# Patient Record
Sex: Female | Born: 1955 | Race: White | Hispanic: No | Marital: Married | Smoking: Never smoker
Health system: Southern US, Community
[De-identification: ages and names within clinical notes are randomized; demographics above are authoritative.]

## PROBLEM LIST (undated history)

## (undated) ENCOUNTER — Emergency Department: Discharge status: Absent without leave | Payer: PRIVATE HEALTH INSURANCE

## (undated) DIAGNOSIS — M1990 Unspecified osteoarthritis, unspecified site: Secondary | ICD-10-CM

## (undated) DIAGNOSIS — M8580 Other specified disorders of bone density and structure, unspecified site: Secondary | ICD-10-CM

## (undated) DIAGNOSIS — G4700 Insomnia, unspecified: Secondary | ICD-10-CM

## (undated) DIAGNOSIS — K219 Gastro-esophageal reflux disease without esophagitis: Secondary | ICD-10-CM

## (undated) DIAGNOSIS — E785 Hyperlipidemia, unspecified: Secondary | ICD-10-CM

## (undated) DIAGNOSIS — R519 Headache, unspecified: Secondary | ICD-10-CM

## (undated) DIAGNOSIS — K759 Inflammatory liver disease, unspecified: Secondary | ICD-10-CM

## (undated) DIAGNOSIS — B009 Herpesviral infection, unspecified: Secondary | ICD-10-CM

## (undated) DIAGNOSIS — T7840XA Allergy, unspecified, initial encounter: Secondary | ICD-10-CM

## (undated) DIAGNOSIS — I10 Essential (primary) hypertension: Secondary | ICD-10-CM

## (undated) HISTORY — PX: JOINT REPLACEMENT: SHX530

## (undated) HISTORY — DX: Essential (primary) hypertension: I10

## (undated) HISTORY — PX: TUBAL LIGATION: SHX77

## (undated) HISTORY — DX: Hyperlipidemia, unspecified: E78.5

## (undated) HISTORY — DX: Insomnia, unspecified: G47.00

## (undated) HISTORY — DX: Herpesviral infection, unspecified: B00.9

## (undated) HISTORY — DX: Allergy, unspecified, initial encounter: T78.40XA

## (undated) HISTORY — DX: Other specified disorders of bone density and structure, unspecified site: M85.80

---

## 1999-02-17 ENCOUNTER — Ambulatory Visit: Admission: RE | Admit: 1999-02-17 | Discharge: 1999-02-17 | Payer: Self-pay | Admitting: Pulmonary Disease

## 1999-04-15 ENCOUNTER — Other Ambulatory Visit: Admission: RE | Admit: 1999-04-15 | Discharge: 1999-04-15 | Payer: Self-pay | Admitting: Gynecology

## 2000-04-19 ENCOUNTER — Other Ambulatory Visit: Admission: RE | Admit: 2000-04-19 | Discharge: 2000-04-19 | Payer: Self-pay | Admitting: Gynecology

## 2001-04-21 ENCOUNTER — Other Ambulatory Visit: Admission: RE | Admit: 2001-04-21 | Discharge: 2001-04-21 | Payer: Self-pay | Admitting: Gynecology

## 2004-10-08 ENCOUNTER — Other Ambulatory Visit: Admission: RE | Admit: 2004-10-08 | Discharge: 2004-10-08 | Payer: Self-pay | Admitting: Gynecology

## 2005-11-25 ENCOUNTER — Other Ambulatory Visit: Admission: RE | Admit: 2005-11-25 | Discharge: 2005-11-25 | Payer: Self-pay | Admitting: Gynecology

## 2008-09-14 ENCOUNTER — Encounter: Payer: Self-pay | Admitting: Women's Health

## 2008-09-14 ENCOUNTER — Other Ambulatory Visit: Admission: RE | Admit: 2008-09-14 | Discharge: 2008-09-14 | Payer: Self-pay | Admitting: Gynecology

## 2008-09-14 ENCOUNTER — Ambulatory Visit: Payer: Self-pay | Admitting: Women's Health

## 2013-06-15 ENCOUNTER — Ambulatory Visit (INDEPENDENT_AMBULATORY_CARE_PROVIDER_SITE_OTHER): Payer: PRIVATE HEALTH INSURANCE | Admitting: Women's Health

## 2013-06-15 ENCOUNTER — Encounter: Payer: Self-pay | Admitting: Women's Health

## 2013-06-15 ENCOUNTER — Other Ambulatory Visit (HOSPITAL_COMMUNITY)
Admission: RE | Admit: 2013-06-15 | Discharge: 2013-06-15 | Disposition: A | Discharge status: Home / Self Care | Payer: PRIVATE HEALTH INSURANCE | Source: Ambulatory Visit | Attending: Gynecology | Admitting: Gynecology

## 2013-06-15 VITALS — BP 130/90 | Ht 64.0 in | Wt 167.0 lb

## 2013-06-15 DIAGNOSIS — Z01419 Encounter for gynecological examination (general) (routine) without abnormal findings: Secondary | ICD-10-CM | POA: Insufficient documentation

## 2013-06-15 DIAGNOSIS — N949 Unspecified condition associated with female genital organs and menstrual cycle: Secondary | ICD-10-CM

## 2013-06-15 DIAGNOSIS — I10 Essential (primary) hypertension: Secondary | ICD-10-CM | POA: Insufficient documentation

## 2013-06-15 DIAGNOSIS — N9489 Other specified conditions associated with female genital organs and menstrual cycle: Secondary | ICD-10-CM

## 2013-06-15 DIAGNOSIS — R300 Dysuria: Secondary | ICD-10-CM

## 2013-06-15 LAB — URINALYSIS W MICROSCOPIC + REFLEX CULTURE
Bilirubin Urine: NEGATIVE
Glucose, UA: NEGATIVE mg/dL
Protein, ur: NEGATIVE mg/dL

## 2013-06-15 LAB — WET PREP FOR TRICH, YEAST, CLUE
Trich, Wet Prep: NONE SEEN
Yeast Wet Prep HPF POC: NONE SEEN

## 2013-06-15 NOTE — Addendum Note (Signed)
Addended by: Richardson Chiquito on: 06/15/2013 12:48 PM   Modules accepted: Orders

## 2013-06-15 NOTE — Patient Instructions (Signed)
Colonoscopy Dexa Vit D 2000 daily Health Recommendations for Postmenopausal Women Respected and ongoing research has looked at the most common causes of death, disability, and poor quality of life in postmenopausal women. The causes include heart disease, diseases of blood vessels, diabetes, depression, cancer, and bone loss (osteoporosis). Many things can be done to help lower the chances of developing these and other common problems: CARDIOVASCULAR DISEASE Heart Disease: A heart attack is a medical emergency. Know the signs and symptoms of a heart attack. Below are things women can do to reduce their risk for heart disease.   Do not smoke. If you smoke, quit.  Aim for a healthy weight. Being overweight causes many preventable deaths. Eat a healthy and balanced diet and drink an adequate amount of liquids.  Get moving. Make a commitment to be more physically active. Aim for 30 minutes of activity on most, if not all days of the week.  Eat for heart health. Choose a diet that is low in saturated fat and cholesterol and eliminate trans fat. Include whole grains, vegetables, and fruits. Read and understand the labels on food containers before buying.  Know your numbers. Ask your caregiver to check your blood pressure, cholesterol (total, HDL, LDL, triglycerides) and blood glucose. Work with your caregiver on improving your entire clinical picture.  High blood pressure. Limit or stop your table salt intake (try salt substitute and food seasonings). Avoid salty foods and drinks. Read labels on food containers before buying. Eating well and exercising can help control high blood pressure. STROKE  Stroke is a medical emergency. Stroke may be the result of a blood clot in a blood vessel in the brain or by a brain hemorrhage (bleeding). Know the signs and symptoms of a stroke. To lower the risk of developing a stroke:  Avoid fatty foods.  Quit smoking.  Control your diabetes, blood pressure, and  irregular heart rate. THROMBOPHLEBITIS (BLOOD CLOT) OF THE LEG  Becoming overweight and leading a stationary lifestyle may also contribute to developing blood clots. Controlling your diet and exercising will help lower the risk of developing blood clots. CANCER SCREENING  Breast Cancer: Take steps to reduce your risk of breast cancer.  You should practice "breast self-awareness." This means understanding the normal appearance and feel of your breasts and should include breast self-examination. Any changes detected, no matter how small, should be reported to your caregiver.  After age 34, you should have a clinical breast exam (CBE) every year.  Starting at age 40, you should consider having a mammogram (breast X-ray) every year.  If you have a family history of breast cancer, talk to your caregiver about genetic screening.  If you are at high risk for breast cancer, talk to your caregiver about having an MRI and a mammogram every year.  Intestinal or Stomach Cancer: Tests to consider are a rectal exam, fecal occult blood, sigmoidoscopy, and colonoscopy. Women who are high risk may need to be screened at an earlier age and more often.  Cervical Cancer:  Beginning at age 79, you should have a Pap test every 3 years as long as the past 3 Pap tests have been normal.  If you have had past treatment for cervical cancer or a condition that could lead to cancer, you need Pap tests and screening for cancer for at least 20 years after your treatment.  If you had a hysterectomy for a problem that was not cancer or a condition that could lead to cancer, then you  no longer need Pap tests.  If you are between ages 71 and 21, and you have had normal Pap tests going back 10 years, you no longer need Pap tests.  If Pap tests have been discontinued, risk factors (such as a new sexual partner) need to be reassessed to determine if screening should be resumed.  Some medical problems can increase the  chance of getting cervical cancer. In these cases, your caregiver may recommend more frequent screening and Pap tests.  Uterine Cancer: If you have vaginal bleeding after reaching menopause, you should notify your caregiver.  Ovarian cancer: Other than yearly pelvic exams, there are no reliable tests available to screen for ovarian cancer at this time except for yearly pelvic exams.  Lung Cancer: Yearly chest X-rays can detect lung cancer and should be done on high risk women, such as cigarette smokers and women with chronic lung disease (emphysema).  Skin Cancer: A complete body skin exam should be done at your yearly examination. Avoid overexposure to the sun and ultraviolet light lamps. Use a strong sun block cream when in the sun. All of these things are important in lowering the risk of skin cancer. MENOPAUSE Menopause Symptoms: Hormone therapy products are effective for treating symptoms associated with menopause:  Moderate to severe hot flashes.  Night sweats.  Mood swings.  Headaches.  Tiredness.  Loss of sex drive.  Insomnia.  Other symptoms. Hormone replacement carries certain risks, especially in older women. Women who use or are thinking about using estrogen or estrogen with progestin treatments should discuss that with their caregiver. Your caregiver will help you understand the benefits and risks. The ideal dose of hormone replacement therapy is not known. The Food and Drug Administration (FDA) has concluded that hormone therapy should be used only at the lowest doses and for the shortest amount of time to reach treatment goals.  OSTEOPOROSIS Protecting Against Bone Loss and Preventing Fracture: If you use hormone therapy for prevention of bone loss (osteoporosis), the risks for bone loss must outweigh the risk of the therapy. Ask your caregiver about other medications known to be safe and effective for preventing bone loss and fractures. To guard against bone loss or  fractures, the following is recommended:  If you are less than age 88, take 1000 mg of calcium and at least 600 mg of Vitamin D per day.  If you are greater than age 12 but less than age 55, take 1200 mg of calcium and at least 600 mg of Vitamin D per day.  If you are greater than age 66, take 1200 mg of calcium and at least 800 mg of Vitamin D per day. Smoking and excessive alcohol intake increases the risk of osteoporosis. Eat foods rich in calcium and vitamin D and do weight bearing exercises several times a week as your caregiver suggests. DIABETES Diabetes Melitus: If you have Type I or Type 2 diabetes, you should keep your blood sugar under control with diet, exercise and recommended medication. Avoid too many sweets, starchy and fatty foods. Being overweight can make control more difficult. COGNITION AND MEMORY Cognition and Memory: Menopausal hormone therapy is not recommended for the prevention of cognitive disorders such as Alzheimer's disease or memory loss.  DEPRESSION  Depression may occur at any age, but is common in elderly women. The reasons may be because of physical, medical, social (loneliness), or financial problems and needs. If you are experiencing depression because of medical problems and control of symptoms, talk to your  caregiver about this. Physical activity and exercise may help with mood and sleep. Community and volunteer involvement may help your sense of value and worth. If you have depression and you feel that the problem is getting worse or becoming severe, talk to your caregiver about treatment options that are best for you. ACCIDENTS  Accidents are common and can be serious in the elderly woman. Prepare your house to prevent accidents. Eliminate throw rugs, place hand bars in the bath, shower and toilet areas. Avoid wearing high heeled shoes or walking on wet, snowy, and icy areas. Limit or stop driving if you have vision or hearing problems, or you feel you are  unsteady with you movements and reflexes. HEPATITIS C Hepatitis C is a type of viral infection affecting the liver. It is spread mainly through contact with blood from an infected person. It can be treated, but if left untreated, it can lead to severe liver damage over years. Many people who are infected do not know that the virus is in their blood. If you are a "baby-boomer", it is recommended that you have one screening test for Hepatitis C. IMMUNIZATIONS  Several immunizations are important to consider having during your senior years, including:   Tetanus, diptheria, and pertussis booster shot.  Influenza every year before the flu season begins.  Pneumonia vaccine.  Shingles vaccine.  Others as indicated based on your specific needs. Talk to your caregiver about these. Document Released: 02/05/2006 Document Revised: 11/30/2012 Document Reviewed: 10/01/2008 [facility] Patient Information 2014 [place].

## 2013-06-15 NOTE — Progress Notes (Signed)
Katrina Simpson [birth year] [id number]    History:    The patient presents for annual exam.  Postmenopausal with no bleeding/no HRT. Last office visit 2009, has gained 25 pounds since. Recently diagnosed with hypertension and started on medication last week by primary care. History of normal mammograms. LGSIL with negative biopsy 1996 with normal Paps after.   Past medical history, past surgical history, family history and social history were all reviewed and documented in the EPIC chart. 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 Four sons, 35, 30, twins 53, all doing well.   ROS:  A  ROS was performed and pertinent positives and negatives are included in the history.  Exam:  Filed Vitals:   06/15/13 1155  BP: 130/90    General appearance:  Normal Head/Neck:  Normal, without cervical or supraclavicular adenopathy. Thyroid:  Symmetrical, normal in size, without palpable masses or nodularity. Respiratory  Effort:  Normal  Auscultation:  Clear without wheezing or rhonchi Cardiovascular  Auscultation:  Regular rate, without rubs, murmurs or gallops  Edema/varicosities:  Not grossly evident Abdominal  Soft,nontender, without masses, guarding or rebound.  Liver/spleen:  No organomegaly noted  Hernia:  None appreciated  Skin  Inspection:  Grossly normal  Palpation:  Grossly normal Neurologic/psychiatric  Orientation:  Normal with appropriate conversation.  Mood/affect:  Normal  Genitourinary    Breasts: Examined lying and sitting.     Right: Without masses, retractions, discharge or axillary adenopathy.     Left: Without masses, retractions, discharge or axillary adenopathy.   Inguinal/mons:  Normal without inguinal adenopathy  External genitalia:  Several sebaceous cysts drained white dry material  BUS/Urethra/Skene's glands:  Normal  Bladder:  Normal  Vagina:  Atrophic  Cervix:  Normal  Uterus:   normal in size, shape and contour.  Midline and mobile  Adnexa/parametria:      Rt: Without masses or tenderness.   Lt: Without masses or tenderness.  Anus and perineum: Normal  Digital rectal exam: Normal sphincter tone without palpated masses or tenderness  Assessment/Plan:  57 y.o. MWF G3P4  for annual exam complaint of vaginal burning and questionable UTI.  Recently diagnosed hypertension managed by primary care labs and meds Postmenopausal/no bleeding/no HRT with vaginal atrophy  Plan: Reviewed normality of wet prep, vaginal atrophy, encouraged vaginal lubricants with intercourse. UA 3-6 WBCs, few bacteria- culture pending. SBE's, annual mammogram, calcium rich diet, vitamin D 2000 daily encouraged. Instructed to schedule a DEXA, screening colonoscopy, Pap, new screening guidelines reviewed. Discussed importance of decreasing calories and increasing exercise for weight loss and health.    [facility] Chall[facility], 12:29 PM 06/15/2013

## 2013-06-17 LAB — URINE CULTURE: Colony Count: 50000

## 2013-06-19 ENCOUNTER — Encounter: Payer: Self-pay | Admitting: Women's Health

## 2013-12-05 ENCOUNTER — Encounter: Payer: Self-pay | Admitting: Women's Health

## 2013-12-05 ENCOUNTER — Ambulatory Visit (INDEPENDENT_AMBULATORY_CARE_PROVIDER_SITE_OTHER): Payer: PRIVATE HEALTH INSURANCE | Admitting: Women's Health

## 2013-12-05 DIAGNOSIS — R350 Frequency of micturition: Secondary | ICD-10-CM

## 2013-12-05 DIAGNOSIS — N9489 Other specified conditions associated with female genital organs and menstrual cycle: Secondary | ICD-10-CM

## 2013-12-05 DIAGNOSIS — N949 Unspecified condition associated with female genital organs and menstrual cycle: Secondary | ICD-10-CM

## 2013-12-05 DIAGNOSIS — N390 Urinary tract infection, site not specified: Secondary | ICD-10-CM

## 2013-12-05 LAB — WET PREP FOR TRICH, YEAST, CLUE
Clue Cells Wet Prep HPF POC: NONE SEEN
Yeast Wet Prep HPF POC: NONE SEEN

## 2013-12-05 LAB — URINALYSIS W MICROSCOPIC + REFLEX CULTURE
Casts: NONE SEEN
Hgb urine dipstick: NEGATIVE
Ketones, ur: NEGATIVE mg/dL
Nitrite: POSITIVE — AB
Protein, ur: NEGATIVE mg/dL
Urobilinogen, UA: 1 mg/dL (ref 0.0–1.0)

## 2013-12-05 MED ORDER — CIPROFLOXACIN HCL 250 MG PO TABS: 250.0000 mg | ORAL_TABLET | Freq: Two times a day (BID) | ORAL | Status: DC

## 2013-12-05 NOTE — Patient Instructions (Signed)
Urinary Tract Infection  Urinary tract infections (UTIs) can develop anywhere along your urinary tract. Your urinary tract is your body's drainage system for removing wastes and extra water. Your urinary tract includes two kidneys, two ureters, a bladder, and a urethra. Your kidneys are a pair of bean-shaped organs. Each kidney is about the size of your fist. They are located below your ribs, one on each side of your spine.  CAUSES  Infections are caused by microbes, which are microscopic organisms, including fungi, viruses, and bacteria. These organisms are so small that they can only be seen through a microscope. Bacteria are the microbes that most commonly cause UTIs.  SYMPTOMS   Symptoms of UTIs may vary by age and gender of the patient and by the location of the infection. Symptoms in young women typically include a frequent and intense urge to urinate and a painful, burning feeling in the bladder or urethra during urination. Older women and men are more likely to be tired, shaky, and weak and have muscle aches and abdominal pain. A fever may mean the infection is in your kidneys. Other symptoms of a kidney infection include pain in your back or sides below the ribs, nausea, and vomiting.  DIAGNOSIS  To diagnose a UTI, your caregiver will ask you about your symptoms. Your caregiver also will ask to provide a urine sample. The urine sample will be tested for bacteria and white blood cells. White blood cells are made by your body to help fight infection.  TREATMENT   Typically, UTIs can be treated with medication. Because most UTIs are caused by a bacterial infection, they usually can be treated with the use of antibiotics. The choice of antibiotic and length of treatment depend on your symptoms and the type of bacteria causing your infection.  HOME CARE INSTRUCTIONS   If you were prescribed antibiotics, take them exactly as your caregiver instructs you. Finish the medication even if you feel better after you  have only taken some of the medication.   Drink enough water and fluids to keep your urine clear or pale yellow.   Avoid caffeine, tea, and carbonated beverages. They tend to irritate your bladder.   Empty your bladder often. Avoid holding urine for long periods of time.   Empty your bladder before and after sexual intercourse.   After a bowel movement, women should cleanse from front to back. Use each tissue only once.  SEEK MEDICAL CARE IF:    You have back pain.   You develop a fever.   Your symptoms do not begin to resolve within 3 days.  SEEK IMMEDIATE MEDICAL CARE IF:    You have severe back pain or lower abdominal pain.   You develop chills.   You have nausea or vomiting.   You have continued burning or discomfort with urination.  MAKE SURE YOU:    Understand these instructions.   Will watch your condition.   Will get help right away if you are not doing well or get worse.  Document Released: 09/23/2005 Document Revised: 06/14/2012 Document Reviewed: 01/22/2012  [employer] Patient Information 2014 [employer].

## 2013-12-05 NOTE — Progress Notes (Signed)
Patient ID: Katrina Simpson, female   DOB: May 21, 1956, 57 y.o.   MRN: [id number] Presents with urinary frequency with urgency and burning. Also vaginal burning. Denies abdominal pain, fever. Had minimal relief with over-the-counter azo.  Exam: Appears well. UA: Small leukocytes, 7-10 WBCs, few bacteria. External genitalia within normal limits, several sebaceous cysts nontender. Speculum exam scant white discharge no erythema or odor noted. Wet prep negative. Bimanual no CMT or adnexal fullness or tenderness.  UTI  Plan: Cipro 250 mg twice daily for 3 days. UTI prevention discussed. Instructed to call if no relief of symptoms. Encourage vaginal lubricants with intercourse.

## 2014-06-22 ENCOUNTER — Encounter: Payer: PRIVATE HEALTH INSURANCE | Admitting: Women's Health

## 2014-06-27 ENCOUNTER — Encounter: Payer: PRIVATE HEALTH INSURANCE | Admitting: Women's Health

## 2014-08-24 ENCOUNTER — Encounter: Payer: Self-pay | Admitting: Women's Health

## 2014-08-24 ENCOUNTER — Ambulatory Visit (INDEPENDENT_AMBULATORY_CARE_PROVIDER_SITE_OTHER): Payer: PRIVATE HEALTH INSURANCE | Admitting: Women's Health

## 2014-08-24 VITALS — BP 124/82 | Ht 63.5 in | Wt 157.2 lb

## 2014-08-24 DIAGNOSIS — Z01419 Encounter for gynecological examination (general) (routine) without abnormal findings: Secondary | ICD-10-CM

## 2014-08-24 DIAGNOSIS — Z780 Asymptomatic menopausal state: Secondary | ICD-10-CM

## 2014-08-24 NOTE — Patient Instructions (Signed)
Health Recommendations for Postmenopausal Women Respected and ongoing research has looked at the most common causes of death, disability, and poor quality of life in postmenopausal women. The causes include heart disease, diseases of blood vessels, diabetes, depression, cancer, and bone loss (osteoporosis). Many things can be done to help lower the chances of developing these and other common problems. CARDIOVASCULAR DISEASE Heart Disease: A heart attack is a medical emergency. Know the signs and symptoms of a heart attack. Below are things women can do to reduce their risk for heart disease.   Do not smoke. If you smoke, quit.  Aim for a healthy weight. Being overweight causes many preventable deaths. Eat a healthy and balanced diet and drink an adequate amount of liquids.  Get moving. Make a commitment to be more physically active. Aim for 30 minutes of activity on most, if not all days of the week.  Eat for heart health. Choose a diet that is low in saturated fat and cholesterol and eliminate trans fat. Include whole grains, vegetables, and fruits. Read and understand the labels on food containers before buying.  Know your numbers. Ask your caregiver to check your blood pressure, cholesterol (total, HDL, LDL, triglycerides) and blood glucose. Work with your caregiver on improving your entire clinical picture.  High blood pressure. Limit or stop your table salt intake (try salt substitute and food seasonings). Avoid salty foods and drinks. Read labels on food containers before buying. Eating well and exercising can help control high blood pressure. STROKE  Stroke is a medical emergency. Stroke may be the result of a blood clot in a blood vessel in the brain or by a brain hemorrhage (bleeding). Know the signs and symptoms of a stroke. To lower the risk of developing a stroke:  Avoid fatty foods.  Quit smoking.  Control your diabetes, blood pressure, and irregular heart rate. THROMBOPHLEBITIS  (BLOOD CLOT) OF THE LEG  Becoming overweight and leading a stationary lifestyle may also contribute to developing blood clots. Controlling your diet and exercising will help lower the risk of developing blood clots. CANCER SCREENING  Breast Cancer: Take steps to reduce your risk of breast cancer.  You should practice "breast self-awareness." This means understanding the normal appearance and feel of your breasts and should include breast self-examination. Any changes detected, no matter how small, should be reported to your caregiver.  After age 40, you should have a clinical breast exam (CBE) every year.  Starting at age 40, you should consider having a mammogram (breast X-ray) every year.  If you have a family history of breast cancer, talk to your caregiver about genetic screening.  If you are at high risk for breast cancer, talk to your caregiver about having an MRI and a mammogram every year.  Intestinal or Stomach Cancer: Tests to consider are a rectal exam, fecal occult blood, sigmoidoscopy, and colonoscopy. Women who are high risk may need to be screened at an earlier age and more often.  Cervical Cancer:  Beginning at age 30, you should have a Pap test every 3 years as long as the past 3 Pap tests have been normal.  If you have had past treatment for cervical cancer or a condition that could lead to cancer, you need Pap tests and screening for cancer for at least 20 years after your treatment.  If you had a hysterectomy for a problem that was not cancer or a condition that could lead to cancer, then you no longer need Pap tests.    If you are between ages 65 and 70, and you have had normal Pap tests going back 10 years, you no longer need Pap tests.  If Pap tests have been discontinued, risk factors (such as a new sexual partner) need to be reassessed to determine if screening should be resumed.  Some medical problems can increase the chance of getting cervical cancer. In these  cases, your caregiver may recommend more frequent screening and Pap tests.  Uterine Cancer: If you have vaginal bleeding after reaching menopause, you should notify your caregiver.  Ovarian Cancer: Other than yearly pelvic exams, there are no reliable tests available to screen for ovarian cancer at this time except for yearly pelvic exams.  Lung Cancer: Yearly chest X-rays can detect lung cancer and should be done on high risk women, such as cigarette smokers and women with chronic lung disease (emphysema).  Skin Cancer: A complete body skin exam should be done at your yearly examination. Avoid overexposure to the sun and ultraviolet light lamps. Use a strong sun block cream when in the sun. All of these things are important for lowering the risk of skin cancer. MENOPAUSE Menopause Symptoms: Hormone therapy products are effective for treating symptoms associated with menopause:  Moderate to severe hot flashes.  Night sweats.  Mood swings.  Headaches.  Tiredness.  Loss of sex drive.  Insomnia.  Other symptoms. Hormone replacement carries certain risks, especially in older women. Women who use or are thinking about using estrogen or estrogen with progestin treatments should discuss that with their caregiver. Your caregiver will help you understand the benefits and risks. The ideal dose of hormone replacement therapy is not known. The Food and Drug Administration (FDA) has concluded that hormone therapy should be used only at the lowest doses and for the shortest amount of time to reach treatment goals.  OSTEOPOROSIS Protecting Against Bone Loss and Preventing Fracture If you use hormone therapy for prevention of bone loss (osteoporosis), the risks for bone loss must outweigh the risk of the therapy. Ask your caregiver about other medications known to be safe and effective for preventing bone loss and fractures. To guard against bone loss or fractures, the following is recommended:  If  you are younger than age 50, take 1000 mg of calcium and at least 600 mg of Vitamin D per day.  If you are older than age 50 but younger than age 70, take 1200 mg of calcium and at least 600 mg of Vitamin D per day.  If you are older than age 70, take 1200 mg of calcium and at least 800 mg of Vitamin D per day. Smoking and excessive alcohol intake increases the risk of osteoporosis. Eat foods rich in calcium and vitamin D and do weight bearing exercises several times a week as your caregiver suggests. DIABETES Diabetes Mellitus: If you have type I or type 2 diabetes, you should keep your blood sugar under control with diet, exercise, and recommended medication. Avoid starchy and fatty foods, and too many sweets. Being overweight can make diabetes control more difficult. COGNITION AND MEMORY Cognition and Memory: Menopausal hormone therapy is not recommended for the prevention of cognitive disorders such as Alzheimer's disease or memory loss.  DEPRESSION  Depression may occur at any age, but it is common in elderly women. This may be because of physical, medical, social (loneliness), or financial problems and needs. If you are experiencing depression because of medical problems and control of symptoms, talk to your caregiver about this. Physical   activity and exercise may help with mood and sleep. Community and volunteer involvement may improve your sense of value and worth. If you have depression and you feel that the problem is getting worse or becoming severe, talk to your caregiver about which treatment options are best for you. ACCIDENTS  Accidents are common and can be serious in elderly woman. Prepare your house to prevent accidents. Eliminate throw rugs, place hand bars in bath, shower, and toilet areas. Avoid wearing high heeled shoes or walking on wet, snowy, and icy areas. Limit or stop driving if you have vision or hearing problems, or if you feel you are unsteady with your movements and  reflexes. HEPATITIS C Hepatitis C is a type of viral infection affecting the liver. It is spread mainly through contact with blood from an infected person. It can be treated, but if left untreated, it can lead to severe liver damage over the years. Many people who are infected do not know that the virus is in their blood. If you are a "baby-boomer", it is recommended that you have one screening test for Hepatitis C. IMMUNIZATIONS  Several immunizations are important to consider having during your senior years, including:   Tetanus, diphtheria, and pertussis booster shot.  Influenza every year before the flu season begins.  Pneumonia vaccine.  Shingles vaccine.  Others, as indicated based on your specific needs. Talk to your caregiver about these. Document Released: 02/05/2006 Document Revised: 04/30/2014 Document Reviewed: 10/01/2008 ExitCare Patient Information 2015 [employer]. This information is not intended to replace advice given to you by your health care provider. Make sure you discuss any questions you have with your health care provider.  

## 2014-08-24 NOTE — Progress Notes (Signed)
Katrina Simpson May 18, 1956 [id number]    History:    Presents for annual exam.  Postmenopausal/no bleeding/no HRT. Has not had a mammogram in years. No DEXA, no colonoscopy. LGSIL 1996 with negative biopsy with all normal Paps after. Hypertension diagnosed in 2014.  Past medical history, past surgical history, family history and social history were all reviewed and documented in the EPIC chart. 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 Has 34 sons, 60 year old son diagnosed with leukemia this past year,  completing chemo. Mother died suddenly 2 months ago from complications following an aneurysm repair.  ROS:  A  12 point ROS was performed and pertinent positives and negatives are included.  Exam:  Filed Vitals:   08/24/14 1141  BP: 124/82    General appearance:  Normal Thyroid:  Symmetrical, normal in size, without palpable masses or nodularity. Respiratory  Auscultation:  Clear without wheezing or rhonchi Cardiovascular  Auscultation:  Regular rate, without rubs, murmurs or gallops  Edema/varicosities:  Not grossly evident Abdominal  Soft,nontender, without masses, guarding or rebound.  Liver/spleen:  No organomegaly noted  Hernia:  None appreciated  Skin  Inspection:  Grossly normal   Breasts: Examined lying and sitting.     Right: Without masses, retractions, discharge or axillary adenopathy.     Left: Without masses, retractions, discharge or axillary adenopathy. Gentitourinary   Inguinal/mons:  Normal without inguinal adenopathy  External genitalia:  Normal  BUS/Urethra/Skene's glands:  Normal  Vagina:  Normal  Cervix:  Normal  Uterus:   normal in size, shape and contour.  Midline and mobile  Adnexa/parametria:     Rt: Without masses or tenderness.   Lt: Without masses or tenderness.  Anus and perineum: Normal  Digital rectal exam: Normal sphincter tone without palpated masses or tenderness  Assessment/Plan:  57 y.o.MWF G4P4  for annual exam with no  complaints.  Postmenopausal/no HRT/no bleeding Hypertension primary care manages labs and meds   Plan: Reviewed importance of screening mammograms, instructed to schedule. Screening colonoscopy instructed to call [facility] to schedule appointment. Bone density will schedule here. SBE's, regular exercise, calcium rich diet, vitamin D 2000 daily encouraged. Reviewed importance of flu shots for entire family, son having chemotherapy. Pap normal 2014, new screening guidelines reviewed.   Note: This dictation was prepared with Dragon/digital dictation.  Any transcriptional errors that result are unintentional. Huel Cote [facility], 12:08 PM 08/24/2014

## 2014-08-28 ENCOUNTER — Other Ambulatory Visit: Payer: Self-pay | Admitting: Gynecology

## 2014-08-28 DIAGNOSIS — Z780 Asymptomatic menopausal state: Secondary | ICD-10-CM

## 2014-08-28 DIAGNOSIS — M8580 Other specified disorders of bone density and structure, unspecified site: Secondary | ICD-10-CM

## 2014-08-28 HISTORY — DX: Other specified disorders of bone density and structure, unspecified site: M85.80

## 2014-09-04 ENCOUNTER — Ambulatory Visit (INDEPENDENT_AMBULATORY_CARE_PROVIDER_SITE_OTHER): Payer: PRIVATE HEALTH INSURANCE

## 2014-09-04 ENCOUNTER — Other Ambulatory Visit: Payer: Self-pay | Admitting: Gynecology

## 2014-09-04 ENCOUNTER — Encounter: Payer: Self-pay | Admitting: Gynecology

## 2014-09-04 DIAGNOSIS — Z780 Asymptomatic menopausal state: Secondary | ICD-10-CM

## 2014-09-04 DIAGNOSIS — M899 Disorder of bone, unspecified: Secondary | ICD-10-CM

## 2014-09-04 DIAGNOSIS — M8580 Other specified disorders of bone density and structure, unspecified site: Secondary | ICD-10-CM

## 2014-09-04 DIAGNOSIS — M949 Disorder of cartilage, unspecified: Secondary | ICD-10-CM

## 2014-10-12 ENCOUNTER — Other Ambulatory Visit: Payer: Self-pay

## 2014-10-29 ENCOUNTER — Encounter: Payer: Self-pay | Admitting: Gynecology

## 2015-05-03 ENCOUNTER — Ambulatory Visit: Payer: PRIVATE HEALTH INSURANCE | Admitting: Anesthesiology

## 2015-05-03 ENCOUNTER — Ambulatory Visit
Admission: RE | Admit: 2015-05-03 | Discharge: 2015-05-03 | Disposition: A | Discharge status: Home / Self Care | Payer: PRIVATE HEALTH INSURANCE | Source: Ambulatory Visit | Attending: Unknown Physician Specialty | Admitting: Unknown Physician Specialty

## 2015-05-03 ENCOUNTER — Encounter: Payer: Self-pay | Admitting: *Deleted

## 2015-05-03 ENCOUNTER — Encounter
Admission: RE | Disposition: A | Discharge status: Home / Self Care | Payer: Self-pay | Source: Ambulatory Visit | Attending: Unknown Physician Specialty

## 2015-05-03 DIAGNOSIS — Z882 Allergy status to sulfonamides status: Secondary | ICD-10-CM | POA: Diagnosis not present

## 2015-05-03 DIAGNOSIS — Z880 Allergy status to penicillin: Secondary | ICD-10-CM | POA: Diagnosis not present

## 2015-05-03 DIAGNOSIS — Z881 Allergy status to other antibiotic agents status: Secondary | ICD-10-CM | POA: Insufficient documentation

## 2015-05-03 DIAGNOSIS — Z79899 Other long term (current) drug therapy: Secondary | ICD-10-CM | POA: Insufficient documentation

## 2015-05-03 DIAGNOSIS — Z823 Family history of stroke: Secondary | ICD-10-CM | POA: Diagnosis not present

## 2015-05-03 DIAGNOSIS — K640 First degree hemorrhoids: Secondary | ICD-10-CM | POA: Insufficient documentation

## 2015-05-03 DIAGNOSIS — D123 Benign neoplasm of transverse colon: Secondary | ICD-10-CM | POA: Diagnosis not present

## 2015-05-03 DIAGNOSIS — Z9851 Tubal ligation status: Secondary | ICD-10-CM | POA: Diagnosis not present

## 2015-05-03 DIAGNOSIS — Z8249 Family history of ischemic heart disease and other diseases of the circulatory system: Secondary | ICD-10-CM | POA: Diagnosis not present

## 2015-05-03 DIAGNOSIS — M8580 Other specified disorders of bone density and structure, unspecified site: Secondary | ICD-10-CM | POA: Diagnosis not present

## 2015-05-03 DIAGNOSIS — Z1211 Encounter for screening for malignant neoplasm of colon: Secondary | ICD-10-CM | POA: Insufficient documentation

## 2015-05-03 DIAGNOSIS — I10 Essential (primary) hypertension: Secondary | ICD-10-CM | POA: Diagnosis not present

## 2015-05-03 DIAGNOSIS — K529 Noninfective gastroenteritis and colitis, unspecified: Secondary | ICD-10-CM | POA: Diagnosis not present

## 2015-05-03 HISTORY — PX: COLONOSCOPY: SHX5424

## 2015-05-03 SURGERY — COLONOSCOPY
Anesthesia: Monitor Anesthesia Care

## 2015-05-03 MED ORDER — MIDAZOLAM HCL 2 MG/2ML IJ SOLN
INTRAMUSCULAR | Status: DC | PRN
  Administered 2015-05-03: 1 mg via INTRAVENOUS

## 2015-05-03 MED ORDER — FENTANYL CITRATE (PF) 100 MCG/2ML IJ SOLN
INTRAMUSCULAR | Status: DC | PRN
  Administered 2015-05-03: 50 ug via INTRAVENOUS

## 2015-05-03 MED ORDER — PROPOFOL INFUSION 10 MG/ML OPTIME
INTRAVENOUS | Status: DC | PRN
  Administered 2015-05-03: 125 ug/kg/min via INTRAVENOUS

## 2015-05-03 MED ORDER — LACTATED RINGERS IV SOLN
INTRAVENOUS | Status: DC | PRN
  Administered 2015-05-03: 10:00:00 via INTRAVENOUS

## 2015-05-03 MED ORDER — SODIUM CHLORIDE 0.9 % IR SOLN
1000.0000 mL | Status: DC
  Administered 2015-05-03: 1000 mL

## 2015-05-03 MED ORDER — SODIUM CHLORIDE 0.9 % IV SOLN: INTRAVENOUS | Status: DC

## 2015-05-03 MED ORDER — PROPOFOL 10 MG/ML IV BOLUS
INTRAVENOUS | Status: DC | PRN
  Administered 2015-05-03 (×2): 40 mg via INTRAVENOUS

## 2015-05-03 NOTE — Anesthesia Postprocedure Evaluation (Signed)
  Anesthesia Post-op Note  Patient: Katrina Simpson  Procedure(s) Performed: Procedure(s): COLONOSCOPY (N/A)  Anesthesia type:MAC  Patient location: PACU  Post pain: Pain level controlled  Post assessment: Post-op Vital signs reviewed, Patient's Cardiovascular Status Stable, Respiratory Function Stable, Patent Airway and No signs of Nausea or vomiting  Post vital signs: Reviewed and stable  Last Vitals:  Filed Vitals:   05/03/15 1050  BP: 109/75  Pulse: 62  Temp:   Resp: 19    Level of consciousness: awake, alert  and patient cooperative  Complications: No apparent anesthesia complications

## 2015-05-03 NOTE — Op Note (Signed)
[facility]astroenter[facility] Patient Name: Tanner Vigna Procedure Date: 05/03/2015 9:33 AM MRN: [id number] Account #: [id number] Date of Birth: [birth year] Admit Type: Outpatient Age: 59 Room: [facility] ENDO ROOM 3 Gender: Female Note Status: Finalized Procedure:         Colonoscopy Indications:       Screening for colorectal malignant neoplasm Providers:         Manya Silvas, MD Medicines:         Propofol per Anesthesia Complications:     No immediate complications. Procedure:         Pre-Anesthesia Assessment:                    - After reviewing the risks and benefits, the patient was                     deemed in satisfactory condition to undergo the procedure.                    After obtaining informed consent, the colonoscope was                     passed under direct vision. Throughout the procedure, the                     patient's blood pressure, pulse, and oxygen saturations                     were monitored continuously. The Colonoscope was                     introduced through the anus and advanced to the the cecum,                     identified by appendiceal orifice and ileocecal valve. The                     colonoscopy was performed without difficulty. The patient                     tolerated the procedure well. The quality of the bowel                     preparation was good. Findings:      A diminutive polyp was found at the hepatic flexure. The polyp was       sessile. The polyp was removed with a jumbo cold forceps. Resection and       retrieval were complete.      A diminutive polyp was found in the transverse colon. The polyp was       sessile. The polyp was removed with a jumbo cold forceps. Resection and       retrieval were complete.      Internal hemorrhoids were found during endoscopy. The hemorrhoids were       small and Grade I (internal hemorrhoids that do not prolapse).      The exam was otherwise without  abnormality. Impression:        - One diminutive polyp at the hepatic flexure. Resected                     and retrieved.                    - One diminutive polyp in the  transverse colon. Resected                     and retrieved.                    - Internal hemorrhoids.                    - The examination was otherwise normal. Recommendation:    - Await pathology results. Manya Silvas, MD 05/03/2015 10:20:56 AM This report has been signed electronically. Number of Addenda: 0 Note Initiated On: 05/03/2015 9:33 AM Scope Withdrawal Time: 0 hours 13 minutes 31 seconds  Total Procedure Duration: 0 hours 18 minutes 40 seconds       [facility]

## 2015-05-03 NOTE — H&P (Signed)
   Primary Care Physician:  Carmon Ginsberg, MD Primary Gastroenterologist:  Dr. Vira Agar  Pre-Procedure History & Physical: HPI:  Katrina Simpson is a 59 y.o. female is here for an colonoscopy.   Past Medical History  Diagnosis Date  . Hypertension   . Osteopenia 08/2014    T score -1.7 FRAX 7.5%/0.6%    Past Surgical History  Procedure Laterality Date  . Cesarean section    . Tubal ligation      Prior to Admission medications   Medication Sig Start Date End Date Taking? Authorizing Provider  acetaminophen (TYLENOL) 500 MG tablet Take 500-1,000 mg by mouth every 6 (six) hours as needed for mild pain.   Yes Historical Provider, MD  chlorthalidone (HYGROTON) 25 MG tablet Take 25 mg by mouth daily.   Yes Historical Provider, MD  ibuprofen (ADVIL,MOTRIN) 200 MG tablet Take 200-400 mg by mouth every 6 (six) hours as needed for headache or moderate pain.   Yes Historical Provider, MD  ciprofloxacin (CIPRO) 250 MG tablet Take 1 tablet (250 mg total) by mouth 2 (two) times daily. 12/05/13   Huel Cote, NP  DiphenhydrAMINE HCl (BENADRYL PO) Take 1 tablet by mouth as needed (allergies, sleep).     Historical Provider, MD    Allergies as of 04/16/2015 - Review Complete 08/24/2014  Allergen Reaction Noted  . Macrobid [nitrofurantoin macrocrystal]    . Penicillins Swelling   . Sulfa antibiotics Itching     Family History  Problem Relation Age of Onset  . Hypertension Mother 58  . Stroke Father 21    History   Social History  . Marital Status: Married    Spouse Name: N/A  . Number of Children: N/A  . Years of Education: N/A   Occupational History  . Not on file.   Social History Main Topics  . Smoking status: Never Smoker   . Smokeless tobacco: Never Used  . Alcohol Use: No  . Drug Use: Not on file  . Sexual Activity: Yes    Birth Control/ Protection: Post-menopausal   Other Topics Concern  . Not on file   Social History Narrative    Review of Systems: See HPI,  otherwise negative ROS  Physical Exam: BP 131/76 mmHg  Pulse 68  Temp(Src) 98.5 F (36.9 C) (Oral)  Resp 17  Ht 5\' 4"  (1.626 m)  Wt 74.844 kg (165 lb)  BMI 28.31 kg/m2  SpO2 100%  LMP 12/28/1998 General:   Alert,  pleasant and cooperative in NAD Head:  Normocephalic and atraumatic. Neck:  Supple; no masses or thyromegaly. Lungs:  Clear throughout to auscultation.    Heart:  Regular rate and rhythm. Abdomen:  Soft, nontender and nondistended. Normal bowel sounds, without guarding, and without rebound.   Neurologic:  Alert and  oriented x4;  grossly normal neurologically.  Impression/Plan: Katrina Simpson is here for an colonoscopy to be performed for screening  Risks, benefits, limitations, and alternatives regarding  colonoscopy have been reviewed with the patient.  Questions have been answered.  All parties agreeable.   Gaylyn Cheers, MD  05/03/2015, 9:44 AM

## 2015-05-03 NOTE — Anesthesia Preprocedure Evaluation (Addendum)
Anesthesia Evaluation  Patient identified by MRN, date of birth, ID band Patient awake    Reviewed: Allergy & Precautions, NPO status , Patient's Chart, lab work & pertinent test results  History of Anesthesia Complications Negative for: history of anesthetic complications  Airway Mallampati: II  TM Distance: >3 FB Neck ROM: Full    Dental no notable dental hx.    Pulmonary neg pulmonary ROS,  breath sounds clear to auscultation  Pulmonary exam normal       Cardiovascular Exercise Tolerance: Good hypertension, Pt. on medications Normal cardiovascular examRhythm:Regular Rate:Normal     Neuro/Psych negative neurological ROS  negative psych ROS   GI/Hepatic negative GI ROS, Neg liver ROS,   Endo/Other  negative endocrine ROS  Renal/GU negative Renal ROS  negative genitourinary   Musculoskeletal negative musculoskeletal ROS (+)   Abdominal   Peds negative pediatric ROS (+)  Hematology negative hematology ROS (+)   Anesthesia Other Findings   Reproductive/Obstetrics negative OB ROS                            Anesthesia Physical Anesthesia Plan  ASA: II  Anesthesia Plan: MAC   Post-op Pain Management:    Induction: Intravenous  Airway Management Planned: Nasal Cannula  Additional Equipment:   Intra-op Plan:   Post-operative Plan:   Informed Consent: I have reviewed the patients History and Physical, chart, labs and discussed the procedure including the risks, benefits and alternatives for the proposed anesthesia with the patient or authorized representative who has indicated his/her understanding and acceptance.     Plan Discussed with: CRNA and Surgeon  Anesthesia Plan Comments:         Anesthesia Quick Evaluation

## 2015-05-03 NOTE — Transfer of Care (Signed)
Immediate Anesthesia Transfer of Care Note  Patient: Katrina Simpson  Procedure(s) Performed: Procedure(s): COLONOSCOPY (N/A)  Patient Location: PACU  Anesthesia Type:MAC  Level of Consciousness: awake, alert  and oriented  Airway & Oxygen Therapy: Patient Spontanous Breathing  Post-op Assessment: Report given to RN  Post vital signs: Reviewed and stable  Last Vitals:  Filed Vitals:   05/03/15 0927  BP: 131/76  Pulse: 68  Temp: 36.9 C  Resp: 17    Complications: No apparent anesthesia complications

## 2015-05-21 LAB — SURGICAL PATHOLOGY

## 2016-02-14 ENCOUNTER — Other Ambulatory Visit: Payer: Self-pay | Admitting: Family Medicine

## 2016-02-14 ENCOUNTER — Telehealth: Payer: Self-pay | Admitting: Family Medicine

## 2016-02-14 DIAGNOSIS — E785 Hyperlipidemia, unspecified: Secondary | ICD-10-CM

## 2016-02-14 DIAGNOSIS — I10 Essential (primary) hypertension: Secondary | ICD-10-CM

## 2016-02-14 NOTE — Telephone Encounter (Signed)
Lab slip up front for pickup. 

## 2016-02-14 NOTE — Telephone Encounter (Signed)
Please review and advise. KW 

## 2016-02-14 NOTE — Telephone Encounter (Signed)
Pt is scheduled for a follow up for medications and would like to get a lab slip next week so we will have the results back before her appt. Thanks TNP

## 2016-02-17 NOTE — Telephone Encounter (Signed)
[id number] 

## 2016-02-24 NOTE — Telephone Encounter (Signed)
Patient was advised, Katrina Simpson

## 2016-02-25 ENCOUNTER — Ambulatory Visit (INDEPENDENT_AMBULATORY_CARE_PROVIDER_SITE_OTHER): Payer: PRIVATE HEALTH INSURANCE | Admitting: Family Medicine

## 2016-02-25 ENCOUNTER — Encounter: Payer: Self-pay | Admitting: Family Medicine

## 2016-02-25 VITALS — BP 114/84 | HR 66 | Temp 98.4°F | Resp 16 | Wt 174.6 lb

## 2016-02-25 DIAGNOSIS — E785 Hyperlipidemia, unspecified: Secondary | ICD-10-CM | POA: Insufficient documentation

## 2016-02-25 DIAGNOSIS — I10 Essential (primary) hypertension: Secondary | ICD-10-CM

## 2016-02-25 MED ORDER — CHLORTHALIDONE 25 MG PO TABS: 25.0000 mg | ORAL_TABLET | Freq: Every day | ORAL | Status: DC

## 2016-02-25 NOTE — Patient Instructions (Signed)
We will call with lab results. Please check on your immunization records. Consider the shingles vaccine.

## 2016-02-25 NOTE — Progress Notes (Signed)
Subjective:     Patient ID: Katrina Simpson, female   DOB: 12-Jan-1956, 60 y.o.   MRN: [id number]  HPI  Chief Complaint  Patient presents with  . Hypertension    Patient comes in office today for follow up, last office visit was 02/08/15 blood pressure in house was 98/68. At last visit patient was advised to continue Chlorthalidone 51m , she reports good compliance, tolerance and symptom control.   Has obtained screening colonoscopy with polypectomy-repeat in 2019. Has updated her gyn health maintenance. She will check work records for Tdap and have encouraged her to get the Shingles vaccine.   Review of Systems  Respiratory: Negative for shortness of breath.   Cardiovascular: Negative for chest pain and palpitations.  Neurological: Negative for dizziness.       Uses diphenhydramine 50 mg.at bedtime for sleep and has helped prevent frequent headaches.       Objective:   Physical Exam  Constitutional: She appears well-developed and well-nourished. No distress.  Cardiovascular: Normal rate and regular rhythm.   Pulmonary/Chest: Breath sounds normal.  Musculoskeletal: She exhibits no edema (of lower extremities).       Assessment:    1. Essential hypertension, benign - chlorthalidone (HYGROTON) 25 MG tablet; Take 1 tablet (25 mg total) by mouth daily.  Dispense: 90 tablet; Refill: 3    Plan:    Has lab slip for lipid profile and met c-will f/u with her when lab results available.

## 2016-03-02 ENCOUNTER — Other Ambulatory Visit: Payer: Self-pay | Admitting: Family Medicine

## 2016-03-03 ENCOUNTER — Telehealth: Payer: Self-pay

## 2016-03-03 ENCOUNTER — Other Ambulatory Visit: Payer: Self-pay | Admitting: Family Medicine

## 2016-03-03 DIAGNOSIS — E785 Hyperlipidemia, unspecified: Secondary | ICD-10-CM

## 2016-03-03 LAB — COMPREHENSIVE METABOLIC PANEL
ALBUMIN: 4.2 g/dL (ref 3.6–4.8)
ALT: 22 IU/L (ref 0–32)
AST: 20 IU/L (ref 0–40)
Albumin/Globulin Ratio: 1.4 (ref 1.1–2.5)
Alkaline Phosphatase: 81 IU/L (ref 39–117)
BILIRUBIN TOTAL: 0.5 mg/dL (ref 0.0–1.2)
BUN / CREAT RATIO: 15 (ref 11–26)
BUN: 16 mg/dL (ref 8–27)
CHLORIDE: 96 mmol/L (ref 96–106)
CO2: 27 mmol/L (ref 18–29)
CREATININE: 1.07 mg/dL — AB (ref 0.57–1.00)
Calcium: 9.9 mg/dL (ref 8.7–10.3)
GFR calc Af Amer: 65 mL/min/{1.73_m2} (ref >59)
GFR calc non Af Amer: 56 mL/min/{1.73_m2} — ABNORMAL LOW (ref >59)
GLUCOSE: 98 mg/dL (ref 65–99)
Globulin, Total: 2.9 g/dL (ref 1.5–4.5)
Potassium: 4.2 mmol/L (ref 3.5–5.2)
Sodium: 140 mmol/L (ref 134–144)
TOTAL PROTEIN: 7.1 g/dL (ref 6.0–8.5)

## 2016-03-03 LAB — LIPID PANEL
Chol/HDL Ratio: 6.3 ratio units — ABNORMAL HIGH (ref 0.0–4.4)
Cholesterol, Total: 289 mg/dL — ABNORMAL HIGH (ref 100–199)
HDL: 46 mg/dL (ref >39)
LDL CALC: 208 mg/dL — AB (ref 0–99)
Triglycerides: 175 mg/dL — ABNORMAL HIGH (ref 0–149)
VLDL CHOLESTEROL CAL: 35 mg/dL (ref 5–40)

## 2016-03-03 MED ORDER — ATORVASTATIN CALCIUM 80 MG PO TABS: 80.0000 mg | ORAL_TABLET | Freq: Every day | ORAL | Status: DC

## 2016-03-03 NOTE — Telephone Encounter (Signed)
-----   Message from Carmon Ginsberg, [place] sent at 03/03/2016  2:39 PM EST ----- Your kidney function has declined slightly so would repeat those labs when we recheck your cholesterol. Your LDL cholesterol is >200 again. You are at high risk for a cardiovascular event. Would recommend generic Lipitor and repeat your labs in 6 weeks.

## 2016-03-03 NOTE — Telephone Encounter (Signed)
Patient was advised she would like script sent to [facility]

## 2016-03-06 NOTE — Telephone Encounter (Signed)
Yes

## 2016-03-06 NOTE — Telephone Encounter (Signed)
Patient has been advised she reports that she has picked up her medication and is doing well with it, and has lost a total of 4lbs this week. KW

## 2016-03-06 NOTE — Telephone Encounter (Signed)
Patient would like to know if it is ok to take CoQ10 along with the new cholesterol medication that was prescribed? Please advise. Thanks!

## 2016-05-07 ENCOUNTER — Telehealth: Payer: Self-pay | Admitting: Family Medicine

## 2016-05-12 ENCOUNTER — Ambulatory Visit (INDEPENDENT_AMBULATORY_CARE_PROVIDER_SITE_OTHER): Payer: PRIVATE HEALTH INSURANCE | Admitting: Family Medicine

## 2016-05-12 ENCOUNTER — Encounter: Payer: Self-pay | Admitting: Family Medicine

## 2016-05-12 VITALS — BP 116/86 | HR 77 | Temp 98.5°F | Resp 16 | Wt 165.2 lb

## 2016-05-12 DIAGNOSIS — E785 Hyperlipidemia, unspecified: Secondary | ICD-10-CM | POA: Diagnosis not present

## 2016-05-12 DIAGNOSIS — R519 Headache, unspecified: Secondary | ICD-10-CM | POA: Insufficient documentation

## 2016-05-12 DIAGNOSIS — G4700 Insomnia, unspecified: Secondary | ICD-10-CM

## 2016-05-12 DIAGNOSIS — Z87898 Personal history of other specified conditions: Secondary | ICD-10-CM | POA: Insufficient documentation

## 2016-05-12 DIAGNOSIS — I10 Essential (primary) hypertension: Secondary | ICD-10-CM | POA: Diagnosis not present

## 2016-05-12 MED ORDER — ATORVASTATIN CALCIUM 80 MG PO TABS: 80.0000 mg | ORAL_TABLET | Freq: Every day | ORAL | Status: DC

## 2016-05-12 MED ORDER — NORTRIPTYLINE HCL 10 MG PO CAPS: ORAL_CAPSULE | ORAL | Status: DC

## 2016-05-12 NOTE — Progress Notes (Signed)
Subjective:     Patient ID: Katrina Simpson, female   DOB: 04/04/1956, 60 y.o.   MRN: [id number]  HPI  Chief Complaint  Patient presents with  . Hypertension    Follow up from 02/25/16, patient was advised last office visit to continue Chlortalidone 25mg . Blood pressure at last visit was 114/84, patient reports good compliance and tolerance on medication  . Hyperlipidemia    Follow up, patient had Lipid Panel screen on 03/02/16 which showed a total cholesterol of 289 amd LdL of 208. On 03/03/16 patient was started on Lipitor 80mg , she reports good compliance and tolerance on medication.   Also wishes to get back on medication to help her sleep. States she ruminates at night and has trouble getting to sleep. Consequently she develops headaches for which she takes ibuprofen.   Review of Systems     Objective:   Physical Exam  Constitutional: She appears well-developed and well-nourished. No distress.  Cardiovascular: Normal rate and regular rhythm.   Pulmonary/Chest: Breath sounds normal.  Musculoskeletal: She exhibits no edema (of lower extremities).       Assessment:    1. Essential hypertension, benign: will recheck GFR - Comprehensive metabolic panel  2. Hyperlipidemia - Lipid panel - atorvastatin (LIPITOR) 80 MG tablet; Take 1 tablet (80 mg total) by mouth daily.  Dispense: 90 tablet; Refill: 3  3. Insomnia - nortriptyline (PAMELOR) 10 MG capsule; One to three at bedtime  Dispense: 30 capsule; Refill: 0    Plan:    Discussed sleep hygiene. Further f/u pending lab work. Phone f/u regarding nortriptyline.

## 2016-05-12 NOTE — Patient Instructions (Signed)
We will call you with the lab results. Here are some ideas to help you sleep better: 1.Keep the same bedtime every night 2.Use your bed for sleep and sex only 3. Avoid exercise or stimulating electronic activity (video games/smart phones) prior to bedtime 4.Avoid alcohol or caffeine 2-3 hours before bedtime 5.Take a bath/shower prior to bedtime. This will help cool your body down and prepare it for sleep. 6.If you can't sleep, get up and do a sleep inducing activity like reading,

## 2016-05-19 ENCOUNTER — Telehealth: Payer: Self-pay

## 2016-05-19 LAB — LIPID PANEL
Chol/HDL Ratio: 3.2 ratio units (ref 0.0–4.4)
Cholesterol, Total: 148 mg/dL (ref 100–199)
HDL: 46 mg/dL (ref >39)
LDL Calculated: 78 mg/dL (ref 0–99)
Triglycerides: 120 mg/dL (ref 0–149)
VLDL CHOLESTEROL CAL: 24 mg/dL (ref 5–40)

## 2016-05-19 LAB — COMPREHENSIVE METABOLIC PANEL
ALT: 25 IU/L (ref 0–32)
AST: 21 IU/L (ref 0–40)
Albumin/Globulin Ratio: 1.6 (ref 1.2–2.2)
Albumin: 4.2 g/dL (ref 3.6–4.8)
Alkaline Phosphatase: 99 IU/L (ref 39–117)
BUN/Creatinine Ratio: 16 (ref 12–28)
BUN: 16 mg/dL (ref 8–27)
Bilirubin Total: 0.8 mg/dL (ref 0.0–1.2)
CALCIUM: 9.6 mg/dL (ref 8.7–10.3)
CHLORIDE: 91 mmol/L — AB (ref 96–106)
CO2: 28 mmol/L (ref 18–29)
Creatinine, Ser: 0.97 mg/dL (ref 0.57–1.00)
GFR calc Af Amer: 73 mL/min/{1.73_m2} (ref >59)
GFR, EST NON AFRICAN AMERICAN: 63 mL/min/{1.73_m2} (ref >59)
GLUCOSE: 89 mg/dL (ref 65–99)
Globulin, Total: 2.6 g/dL (ref 1.5–4.5)
Potassium: 3.7 mmol/L (ref 3.5–5.2)
Sodium: 138 mmol/L (ref 134–144)
TOTAL PROTEIN: 6.8 g/dL (ref 6.0–8.5)

## 2016-05-19 NOTE — Telephone Encounter (Signed)
Patient has been advised. KW 

## 2016-05-19 NOTE — Telephone Encounter (Signed)
-----   Message from Carmon Ginsberg, [place] sent at 05/19/2016  7:57 AM EDT ----- Lab look great with LDL cholesterol well below 100. Continue atorvastatin.

## 2016-06-15 ENCOUNTER — Other Ambulatory Visit: Payer: Self-pay | Admitting: Family Medicine

## 2016-06-15 ENCOUNTER — Encounter: Payer: Self-pay | Admitting: Family Medicine

## 2016-06-15 DIAGNOSIS — G4700 Insomnia, unspecified: Secondary | ICD-10-CM

## 2016-06-15 MED ORDER — NORTRIPTYLINE HCL 10 MG PO CAPS: ORAL_CAPSULE | ORAL | Status: DC

## 2016-06-24 ENCOUNTER — Encounter: Payer: Self-pay | Admitting: Family Medicine

## 2016-07-17 ENCOUNTER — Other Ambulatory Visit: Payer: Self-pay | Admitting: Family Medicine

## 2016-07-17 ENCOUNTER — Encounter: Payer: Self-pay | Admitting: Family Medicine

## 2016-07-17 DIAGNOSIS — G4700 Insomnia, unspecified: Secondary | ICD-10-CM

## 2016-07-17 MED ORDER — NORTRIPTYLINE HCL 10 MG PO CAPS: ORAL_CAPSULE | ORAL | Status: DC

## 2016-08-05 ENCOUNTER — Encounter: Payer: Self-pay | Admitting: Family Medicine

## 2016-08-18 ENCOUNTER — Encounter: Payer: Self-pay | Admitting: Family Medicine

## 2016-08-18 ENCOUNTER — Other Ambulatory Visit: Payer: Self-pay | Admitting: Family Medicine

## 2016-08-18 DIAGNOSIS — E785 Hyperlipidemia, unspecified: Secondary | ICD-10-CM

## 2016-08-18 MED ORDER — PRAVASTATIN SODIUM 80 MG PO TABS: 80.0000 mg | ORAL_TABLET | Freq: Every day | ORAL | 3 refills | Status: DC

## 2016-10-07 ENCOUNTER — Encounter: Payer: Self-pay | Admitting: Family Medicine

## 2016-10-17 ENCOUNTER — Ambulatory Visit (INDEPENDENT_AMBULATORY_CARE_PROVIDER_SITE_OTHER): Payer: PRIVATE HEALTH INSURANCE

## 2016-10-17 DIAGNOSIS — Z23 Encounter for immunization: Secondary | ICD-10-CM | POA: Diagnosis not present

## 2016-11-20 ENCOUNTER — Encounter: Payer: Self-pay | Admitting: Family Medicine

## 2016-11-23 ENCOUNTER — Other Ambulatory Visit: Payer: Self-pay | Admitting: Family Medicine

## 2016-12-30 ENCOUNTER — Encounter: Payer: Self-pay | Admitting: Family Medicine

## 2016-12-31 ENCOUNTER — Other Ambulatory Visit: Payer: Self-pay | Admitting: Family Medicine

## 2016-12-31 DIAGNOSIS — G4700 Insomnia, unspecified: Secondary | ICD-10-CM

## 2016-12-31 MED ORDER — NORTRIPTYLINE HCL 10 MG PO CAPS: ORAL_CAPSULE | ORAL | 5 refills | Status: DC

## 2017-02-23 ENCOUNTER — Encounter: Payer: PRIVATE HEALTH INSURANCE | Admitting: Women's Health

## 2017-03-02 ENCOUNTER — Encounter: Payer: Self-pay | Admitting: Women's Health

## 2017-03-02 ENCOUNTER — Ambulatory Visit (INDEPENDENT_AMBULATORY_CARE_PROVIDER_SITE_OTHER): Payer: PRIVATE HEALTH INSURANCE | Admitting: Women's Health

## 2017-03-02 ENCOUNTER — Other Ambulatory Visit: Payer: Self-pay | Admitting: Women's Health

## 2017-03-02 VITALS — BP 130/80 | Ht 63.5 in | Wt 174.0 lb

## 2017-03-02 DIAGNOSIS — Z13820 Encounter for screening for osteoporosis: Secondary | ICD-10-CM | POA: Diagnosis not present

## 2017-03-02 DIAGNOSIS — Z01419 Encounter for gynecological examination (general) (routine) without abnormal findings: Secondary | ICD-10-CM | POA: Diagnosis not present

## 2017-03-02 DIAGNOSIS — Z1231 Encounter for screening mammogram for malignant neoplasm of breast: Secondary | ICD-10-CM

## 2017-03-02 DIAGNOSIS — Z1151 Encounter for screening for human papillomavirus (HPV): Secondary | ICD-10-CM

## 2017-03-02 NOTE — Patient Instructions (Signed)
Vit d 3  2000 daily  Health Maintenance for Postmenopausal Women Menopause is a normal process in which your reproductive ability comes to an end. This process happens gradually over a span of months to years, usually between the ages of 71 and 24. Menopause is complete when you have missed 12 consecutive menstrual periods. It is important to talk with your health care provider about some of the most common conditions that affect postmenopausal women, such as heart disease, cancer, and bone loss (osteoporosis). Adopting a healthy lifestyle and getting preventive care can help to promote your health and wellness. Those actions can also lower your chances of developing some of these common conditions. What should I know about menopause? During menopause, you may experience a number of symptoms, such as:  Moderate-to-severe hot flashes.  Night sweats.  Decrease in sex drive.  Mood swings.  Headaches.  Tiredness.  Irritability.  Memory problems.  Insomnia. Choosing to treat or not to treat menopausal changes is an individual decision that you make with your health care provider. What should I know about hormone replacement therapy and supplements? Hormone therapy products are effective for treating symptoms that are associated with menopause, such as hot flashes and night sweats. Hormone replacement carries certain risks, especially as you become older. If you are thinking about using estrogen or estrogen with progestin treatments, discuss the benefits and risks with your health care provider. What should I know about heart disease and stroke? Heart disease, heart attack, and stroke become more likely as you age. This may be due, in part, to the hormonal changes that your body experiences during menopause. These can affect how your body processes dietary fats, triglycerides, and cholesterol. Heart attack and stroke are both medical emergencies. There are many things that you can do to help  prevent heart disease and stroke:  Have your blood pressure checked at least every 1-2 years. High blood pressure causes heart disease and increases the risk of stroke.  If you are 47-28 years old, ask your health care provider if you should take aspirin to prevent a heart attack or a stroke.  Do not use any tobacco products, including cigarettes, chewing tobacco, or electronic cigarettes. If you need help quitting, ask your health care provider.  It is important to eat a healthy diet and maintain a healthy weight.  Be sure to include plenty of vegetables, fruits, low-fat dairy products, and lean protein.  Avoid eating foods that are high in solid fats, added sugars, or salt (sodium).  Get regular exercise. This is one of the most important things that you can do for your health.  Try to exercise for at least 150 minutes each week. The type of exercise that you do should increase your heart rate and make you sweat. This is known as moderate-intensity exercise.  Try to do strengthening exercises at least twice each week. Do these in addition to the moderate-intensity exercise.  Know your numbers.Ask your health care provider to check your cholesterol and your blood glucose. Continue to have your blood tested as directed by your health care provider. What should I know about cancer screening? There are several types of cancer. Take the following steps to reduce your risk and to catch any cancer development as early as possible. Breast Cancer  Practice breast self-awareness.  This means understanding how your breasts normally appear and feel.  It also means doing regular breast self-exams. Let your health care provider know about any changes, no matter how small.  If you are 23 or older, have a clinician do a breast exam (clinical breast exam or CBE) every year. Depending on your age, family history, and medical history, it may be recommended that you also have a yearly breast X-ray  (mammogram).  If you have a family history of breast cancer, talk with your health care provider about genetic screening.  If you are at high risk for breast cancer, talk with your health care provider about having an MRI and a mammogram every year.  Breast cancer (BRCA) gene test is recommended for women who have family members with BRCA-related cancers. Results of the assessment will determine the need for genetic counseling and BRCA1 and for BRCA2 testing. BRCA-related cancers include these types:  Breast. This occurs in males or females.  Ovarian.  Tubal. This may also be called fallopian tube cancer.  Cancer of the abdominal or pelvic lining (peritoneal cancer).  Prostate.  Pancreatic. Cervical, Uterine, and Ovarian Cancer  Your health care provider may recommend that you be screened regularly for cancer of the pelvic organs. These include your ovaries, uterus, and vagina. This screening involves a pelvic exam, which includes checking for microscopic changes to the surface of your cervix (Pap test).  For women ages 21-65, health care providers may recommend a pelvic exam and a Pap test every three years. For women ages 18-65, they may recommend the Pap test and pelvic exam, combined with testing for human papilloma virus (HPV), every five years. Some types of HPV increase your risk of cervical cancer. Testing for HPV may also be done on women of any age who have unclear Pap test results.  Other health care providers may not recommend any screening for nonpregnant women who are considered low risk for pelvic cancer and have no symptoms. Ask your health care provider if a screening pelvic exam is right for you.  If you have had past treatment for cervical cancer or a condition that could lead to cancer, you need Pap tests and screening for cancer for at least 20 years after your treatment. If Pap tests have been discontinued for you, your risk factors (such as having a new sexual  partner) need to be reassessed to determine if you should start having screenings again. Some women have medical problems that increase the chance of getting cervical cancer. In these cases, your health care provider may recommend that you have screening and Pap tests more often.  If you have a family history of uterine cancer or ovarian cancer, talk with your health care provider about genetic screening.  If you have vaginal bleeding after reaching menopause, tell your health care provider.  There are currently no reliable tests available to screen for ovarian cancer. Lung Cancer  Lung cancer screening is recommended for adults 77-68 years old who are at high risk for lung cancer because of a history of smoking. A yearly low-dose CT scan of the lungs is recommended if you:  Currently smoke.  Have a history of at least 30 pack-years of smoking and you currently smoke or have quit within the past 15 years. A pack-year is smoking an average of one pack of cigarettes per day for one year. Yearly screening should:  Continue until it has been 15 years since you quit.  Stop if you develop a health problem that would prevent you from having lung cancer treatment. Colorectal Cancer  This type of cancer can be detected and can often be prevented.  Routine colorectal cancer screening usually  begins at age 63 and continues through age 53.  If you have risk factors for colon cancer, your health care provider may recommend that you be screened at an earlier age.  If you have a family history of colorectal cancer, talk with your health care provider about genetic screening.  Your health care provider may also recommend using home test kits to check for hidden blood in your stool.  A small camera at the end of a tube can be used to examine your colon directly (sigmoidoscopy or colonoscopy). This is done to check for the earliest forms of colorectal cancer.  Direct examination of the colon should be  repeated every 5-10 years until age 80. However, if early forms of precancerous polyps or small growths are found or if you have a family history or genetic risk for colorectal cancer, you may need to be screened more often. Skin Cancer  Check your skin from head to toe regularly.  Monitor any moles. Be sure to tell your health care provider:  About any new moles or changes in moles, especially if there is a change in a mole's shape or color.  If you have a mole that is larger than the size of a pencil eraser.  If any of your family members has a history of skin cancer, especially at a Kerin Cecchi age, talk with your health care provider about genetic screening.  Always use sunscreen. Apply sunscreen liberally and repeatedly throughout the day.  Whenever you are outside, protect yourself by wearing long sleeves, pants, a wide-brimmed hat, and sunglasses. What should I know about osteoporosis? Osteoporosis is a condition in which bone destruction happens more quickly than new bone creation. After menopause, you may be at an increased risk for osteoporosis. To help prevent osteoporosis or the bone fractures that can happen because of osteoporosis, the following is recommended:  If you are 29-86 years old, get at least 1,000 mg of calcium and at least 600 mg of vitamin D per day.  If you are older than age 76 but younger than age 63, get at least 1,200 mg of calcium and at least 600 mg of vitamin D per day.  If you are older than age 76, get at least 1,200 mg of calcium and at least 800 mg of vitamin D per day. Smoking and excessive alcohol intake increase the risk of osteoporosis. Eat foods that are rich in calcium and vitamin D, and do weight-bearing exercises several times each week as directed by your health care provider. What should I know about how menopause affects my mental health? Depression may occur at any age, but it is more common as you become older. Common symptoms of depression  include:  Low or sad mood.  Changes in sleep patterns.  Changes in appetite or eating patterns.  Feeling an overall lack of motivation or enjoyment of activities that you previously enjoyed.  Frequent crying spells. Talk with your health care provider if you think that you are experiencing depression. What should I know about immunizations? It is important that you get and maintain your immunizations. These include:  Tetanus, diphtheria, and pertussis (Tdap) booster vaccine.  Influenza every year before the flu season begins.  Pneumonia vaccine.  Shingles vaccine. Your health care provider may also recommend other immunizations. This information is not intended to replace advice given to you by your health care provider. Make sure you discuss any questions you have with your health care provider. Document Released: 02/05/2006 Document Revised: 07/03/2016  Document Reviewed: 09/17/2015 Elsevier Interactive Patient Education  2017 [employer].

## 2017-03-02 NOTE — Addendum Note (Signed)
Addended by: Thurnell Garbe A on: 03/02/2017 02:16 PM   Modules accepted: Orders

## 2017-03-02 NOTE — Progress Notes (Signed)
SHARETTE GEHRING Feb 01, 1956 [id number]    History:    Presents for annual exam.  Postmenopausal on no HRT with no bleeding. Normal  mammogram history, overdue. 1996 LGSIL with normal Paps after.. 2015 T score -1.7 at spine FRAX 7.5%/0.6%. I/2016 benign colon polyp/adenoma 3 year follow-up 2019. Primary care manages hypertension and hypercholesterolemia. Has not had Zostavax.  Past medical history, past surgical history, family history and social history were all reviewed and documented in the EPIC chart. Works at an [employer]. Has 4 sons youngest are twins age 59.  ROS:  A ROS was performed and pertinent positives and negatives are included.  Exam:  Vitals:   03/02/17 1147  BP: 130/80  Weight: 174 lb (78.9 kg)  Height: 5' 3.5" (1.613 m)   Body mass index is 30.34 kg/m.   General appearance:  Normal Thyroid:  Symmetrical, normal in size, without palpable masses or nodularity. Respiratory  Auscultation:  Clear without wheezing or rhonchi Cardiovascular  Auscultation:  Regular rate, without rubs, murmurs or gallops  Edema/varicosities:  Not grossly evident Abdominal  Soft,nontender, without masses, guarding or rebound.  Liver/spleen:  No organomegaly noted  Hernia:  None appreciated  Skin  Inspection:  Grossly normal, numerous moles/keratosis   Breasts: Examined lying and sitting.     Right: Without masses, retractions, discharge or axillary adenopathy.     Left: Without masses, retractions, discharge or axillary adenopathy. Gentitourinary   Inguinal/mons:  Normal without inguinal adenopathy  External genitalia:  Normal  BUS/Urethra/Skene's glands:  Normal, numerous nontender sebaceous cysts  Vagina:  Normal  Cervix:  Normal  Uterus:  normal in size, shape and contour.  Midline and mobile  Adnexa/parametria:     Rt: Without masses or tenderness.   Lt: Without masses or tenderness.  Anus and perineum: Normal  Digital rectal exam: Normal sphincter tone without palpated  masses or tenderness  Assessment/Plan:  61 y.o. M WF G3 P4 for annual exam with no complaints.  Postmenopausal/no HRT/no bleeding Osteopenia without elevated FRAX Hypertension/hypercholesterolemia-primary care manages labs and meds  Plan: Repeat DEXA. Reviewed importance of home safety, fall prevention and importance of weightbearing exercise. Vitamin D 2000 daily encouraged. Has numerous moles, recommended annual dermatology exam. Zostavax recommended encouraged to get a primary care. SBE's, reviewed importance of annual screen, overdue instructed to schedule, breast center information given. UA. Pap with HR HPV typing, new screening guidelines reviewed.  Huel Cote [facility], 12:45 PM 03/02/2017

## 2017-03-04 LAB — PAP, TP IMAGING W/ HPV RNA, RFLX HPV TYPE 16,18/45: HPV MRNA, HIGH RISK: NOT DETECTED

## 2017-03-22 ENCOUNTER — Ambulatory Visit: Payer: PRIVATE HEALTH INSURANCE

## 2017-03-31 ENCOUNTER — Other Ambulatory Visit: Payer: Self-pay | Admitting: Gynecology

## 2017-03-31 DIAGNOSIS — Z13820 Encounter for screening for osteoporosis: Secondary | ICD-10-CM

## 2017-04-02 ENCOUNTER — Ambulatory Visit: Payer: PRIVATE HEALTH INSURANCE

## 2017-04-02 ENCOUNTER — Other Ambulatory Visit: Payer: Self-pay | Admitting: Family Medicine

## 2017-04-02 DIAGNOSIS — I10 Essential (primary) hypertension: Secondary | ICD-10-CM

## 2017-04-21 ENCOUNTER — Ambulatory Visit: Payer: PRIVATE HEALTH INSURANCE

## 2017-05-10 ENCOUNTER — Other Ambulatory Visit: Payer: Self-pay | Admitting: Gynecology

## 2017-05-10 ENCOUNTER — Ambulatory Visit (INDEPENDENT_AMBULATORY_CARE_PROVIDER_SITE_OTHER): Payer: PRIVATE HEALTH INSURANCE

## 2017-05-10 DIAGNOSIS — M8589 Other specified disorders of bone density and structure, multiple sites: Secondary | ICD-10-CM

## 2017-05-10 DIAGNOSIS — Z13820 Encounter for screening for osteoporosis: Secondary | ICD-10-CM | POA: Diagnosis not present

## 2017-05-11 ENCOUNTER — Encounter: Payer: Self-pay | Admitting: Family Medicine

## 2017-05-11 ENCOUNTER — Ambulatory Visit (INDEPENDENT_AMBULATORY_CARE_PROVIDER_SITE_OTHER): Payer: PRIVATE HEALTH INSURANCE | Admitting: Family Medicine

## 2017-05-11 ENCOUNTER — Encounter: Payer: Self-pay | Admitting: Gynecology

## 2017-05-11 VITALS — BP 118/70 | HR 74 | Temp 98.2°F | Resp 16 | Wt 176.8 lb

## 2017-05-11 DIAGNOSIS — Z1159 Encounter for screening for other viral diseases: Secondary | ICD-10-CM

## 2017-05-11 DIAGNOSIS — E782 Mixed hyperlipidemia: Secondary | ICD-10-CM

## 2017-05-11 DIAGNOSIS — I10 Essential (primary) hypertension: Secondary | ICD-10-CM | POA: Diagnosis not present

## 2017-05-11 DIAGNOSIS — Z87898 Personal history of other specified conditions: Secondary | ICD-10-CM

## 2017-05-11 MED ORDER — ATORVASTATIN CALCIUM 80 MG PO TABS: 80.0000 mg | ORAL_TABLET | Freq: Every day | ORAL | 3 refills | Status: DC

## 2017-05-11 MED ORDER — CHLORTHALIDONE 25 MG PO TABS: 25.0000 mg | ORAL_TABLET | Freq: Every day | ORAL | 3 refills | Status: DC

## 2017-05-11 NOTE — Progress Notes (Signed)
Subjective:     Patient ID: Katrina Simpson, female   DOB: 06/06/1956, 61 y.o.   MRN: [id number]  HPI  Chief Complaint  Patient presents with  . Hypertension    Patient returns to office today for follow up last office visit was 05/12/16, blood presure at visit was 116/865. Patient reports good compliance and tolerance on medication, she is not checking her blood pressure outside of office.   . Hyperlipidemia    Patient returns for follow up last office visit was 05/12/16 Lipid panel was ordered which came back normal and patient was advised to continue Lipitor 80mg . Patient reports good compliance and tolerance on medication, patient reports that she is staying active and following a well balanced diet.   . Insomnia    Patient returns for follow up visit last office visit 05/12/16 patient was started on Nortiptyline 10mg . Patient reports good compliance with medication stating that she takes 4 tablets in the PM. Patient averages between 6-7hrs a night waking up at least once every now an again. Patient states that when she does wake up from her sleep it is usually associated with a heachache, but states that she has not had a headache in several weeks. Patient states that she takes Advil for preventative measures.   Recently has had gyn health maintenance.   Review of Systems  Respiratory: Negative for shortness of breath.   Cardiovascular: Negative for chest pain and palpitations.  Neurological: Negative for dizziness.       Objective:   Physical Exam  Constitutional: She appears well-developed and well-nourished. No distress.  Cardiovascular: Normal rate and regular rhythm.   Pulmonary/Chest: Breath sounds normal.  Musculoskeletal: She exhibits no edema (of lower extremities).       Assessment:    1. Essential hypertension, benign - chlorthalidone (HYGROTON) 25 MG tablet; Take 1 tablet (25 mg total) by mouth daily.  Dispense: 90 tablet; Refill: 3 - Comprehensive metabolic panel  2.  H/O headache: controlled with nortriptyline 40 mg.at bedtime  3. Mixed hyperlipidemia - atorvastatin (LIPITOR) 80 MG tablet; Take 1 tablet (80 mg total) by mouth daily.  Dispense: 90 tablet; Refill: 3 - Lipid panel  4. Encounter for hepatitis C screening test for low risk patient - Hepatitis C Antibody    Plan:    Discussed Shingrix and 30 minutes of walking daily. Further f/u pending lab work.

## 2017-05-11 NOTE — Patient Instructions (Signed)
Consider getting Shingrix vaccine for shingles if covered by your insurance. We will call you with the lab results. Start walking 30 minutes daily.

## 2017-05-13 ENCOUNTER — Encounter: Payer: Self-pay | Admitting: Women's Health

## 2017-05-17 ENCOUNTER — Ambulatory Visit: Payer: PRIVATE HEALTH INSURANCE

## 2017-06-03 ENCOUNTER — Ambulatory Visit: Payer: PRIVATE HEALTH INSURANCE

## 2017-07-01 ENCOUNTER — Other Ambulatory Visit: Payer: Self-pay | Admitting: Family Medicine

## 2017-07-01 ENCOUNTER — Encounter: Payer: Self-pay | Admitting: Family Medicine

## 2017-07-01 DIAGNOSIS — E782 Mixed hyperlipidemia: Secondary | ICD-10-CM

## 2017-07-01 MED ORDER — PRAVASTATIN SODIUM 80 MG PO TABS: 80.0000 mg | ORAL_TABLET | Freq: Every day | ORAL | 3 refills | Status: DC

## 2017-07-07 ENCOUNTER — Other Ambulatory Visit: Payer: Self-pay | Admitting: Family Medicine

## 2017-07-07 DIAGNOSIS — G4700 Insomnia, unspecified: Secondary | ICD-10-CM

## 2017-07-20 ENCOUNTER — Ambulatory Visit: Payer: PRIVATE HEALTH INSURANCE

## 2017-09-09 ENCOUNTER — Ambulatory Visit: Payer: PRIVATE HEALTH INSURANCE

## 2017-10-02 ENCOUNTER — Ambulatory Visit (INDEPENDENT_AMBULATORY_CARE_PROVIDER_SITE_OTHER): Payer: PRIVATE HEALTH INSURANCE

## 2017-10-02 DIAGNOSIS — Z23 Encounter for immunization: Secondary | ICD-10-CM | POA: Diagnosis not present

## 2017-10-04 ENCOUNTER — Ambulatory Visit: Payer: PRIVATE HEALTH INSURANCE

## 2017-10-15 ENCOUNTER — Encounter: Payer: Self-pay | Admitting: Family Medicine

## 2017-11-05 ENCOUNTER — Ambulatory Visit: Payer: PRIVATE HEALTH INSURANCE

## 2017-12-08 ENCOUNTER — Ambulatory Visit: Payer: PRIVATE HEALTH INSURANCE

## 2017-12-29 ENCOUNTER — Ambulatory Visit (INDEPENDENT_AMBULATORY_CARE_PROVIDER_SITE_OTHER): Payer: PRIVATE HEALTH INSURANCE | Admitting: Physician Assistant

## 2017-12-29 ENCOUNTER — Encounter: Payer: Self-pay | Admitting: Physician Assistant

## 2017-12-29 VITALS — BP 126/82 | HR 72 | Temp 98.2°F | Resp 16 | Wt 180.0 lb

## 2017-12-29 DIAGNOSIS — L299 Pruritus, unspecified: Secondary | ICD-10-CM | POA: Diagnosis not present

## 2017-12-29 DIAGNOSIS — R238 Other skin changes: Secondary | ICD-10-CM

## 2017-12-29 DIAGNOSIS — R21 Rash and other nonspecific skin eruption: Secondary | ICD-10-CM | POA: Diagnosis not present

## 2017-12-29 MED ORDER — PREDNISONE 10 MG (21) PO TBPK: ORAL_TABLET | ORAL | 0 refills | Status: DC

## 2017-12-29 NOTE — Progress Notes (Signed)
Patient: Katrina Simpson Female    DOB: 1956-01-25   62 y.o.   MRN: [id number] Visit Date: 12/29/2017  Today's Provider: Trinna Post, PA-C   Chief Complaint  Patient presents with  . Rash    Right arm; started a few days ago.   Subjective:    Katrina Simpson is a 62 y/o woman presenting today for rash on her right forearm x 7 days. She reports it suddenly appeared one week ago and is intensely itchy. She reports it appeared mainly as it is now, did not appear as papule and get bigger. She reports it has wept clear fluid, no purulence or bleeding. She has tried hydrocortisone cream and antibiotics with no relief.  Rash  This is a new problem. The current episode started in the past 7 days. The problem is unchanged. The affected locations include the right arm. The rash is characterized by redness, draining and itchiness. She was exposed to plant contact. Past treatments include anti-itch cream and antibiotic cream. The treatment provided no relief.       Allergies  Allergen Reactions  . Macrobid [Nitrofurantoin Macrocrystal]   . Penicillins Swelling  . Sulfa Antibiotics Itching     Current Outpatient Medications:  .  chlorthalidone (HYGROTON) 25 MG tablet, Take 1 tablet (25 mg total) by mouth daily., Disp: 90 tablet, Rfl: 3 .  ibuprofen (ADVIL,MOTRIN) 200 MG tablet, Take 200-400 mg by mouth every 6 (six) hours as needed for headache or moderate pain., Disp: , Rfl:  .  nortriptyline (PAMELOR) 10 MG capsule, TAKE 4 CAPSULES BY MOUTH AT BEDTIME, Disp: 120 capsule, Rfl: 5 .  pravastatin (PRAVACHOL) 80 MG tablet, Take 1 tablet (80 mg total) by mouth daily., Disp: 90 tablet, Rfl: 3  Review of Systems  Constitutional: Negative.   Skin: Positive for rash. Negative for color change, pallor and wound.    Social History   Tobacco Use  . Smoking status: Never Smoker  . Smokeless tobacco: Never Used  Substance Use Topics  . Alcohol use: No   Objective:   BP 126/82 (BP  Location: Left Arm, Patient Position: Sitting, Cuff Size: Large)   Pulse 72   Temp 98.2 F (36.8 C) (Oral)   Resp 16   Wt 180 lb (81.6 kg)   LMP 12/28/1998   BMI 31.39 kg/m  Vitals:   12/29/17 1639  BP: 126/82  Pulse: 72  Resp: 16  Temp: 98.2 F (36.8 C)  TempSrc: Oral  Weight: 180 lb (81.6 kg)     Physical Exam  Constitutional: She is oriented to person, place, and time. She appears well-developed and well-nourished.  Cardiovascular: Normal rate.  Pulmonary/Chest: Effort normal.  Neurological: She is alert and oriented to person, place, and time.  Skin: Skin is warm and dry. Rash noted. There is erythema.  On her right forearm, there is a well demarcated erythematous plaque with surrounding vesicular lesions that weep clear fluid. There is some scaling and evidence of excoriations. No central clearing.   Psychiatric: She has a normal mood and affect. Her behavior is normal.      Media Information   Document Information   Photos  Right forearm   12/29/2017 16:47  Attached To:  Office Visit on 12/29/17 with Trinna Post, PA-C  Source Information   Trinna Post, PA-C  [place]:     1. Vesicular rash  Possibly contact  dermatitis from plants vs. Ringworm vs. Psoriasis. Will give steroids. Instructed to call back immediately if worsening as this may indicate fungal infection.   - predniSONE (STERAPRED UNI-PAK 21 TAB) 10 MG (21) TBPK tablet; Take 6 pills on day 1, 5 pills on day 2, and so on until complete.  Dispense: 21 tablet; Refill: 0  2. Erythematous rash   3. Itching  - predniSONE (STERAPRED UNI-PAK 21 TAB) 10 MG (21) TBPK tablet; Take 6 pills on day 1, 5 pills on day 2, and so on until complete.  Dispense: 21 tablet; Refill: 0  Return if symptoms worsen or fail to improve.  The entirety of the information documented in the History of Present Illness, Review of Systems and Physical Exam were personally  obtained by me. Portions of this information were initially documented by Ashley Royalty, CMA and reviewed by me for thoroughness and accuracy.         Trinna Post, PA-C  [facility]

## 2017-12-29 NOTE — Patient Instructions (Signed)

## 2017-12-30 ENCOUNTER — Encounter: Payer: Self-pay | Admitting: Family Medicine

## 2017-12-30 ENCOUNTER — Telehealth: Payer: Self-pay | Admitting: Family Medicine

## 2017-12-30 NOTE — Telephone Encounter (Signed)
Pt wants to make sure the lab order is still in the system for her from a few weeks ago.    Also, wanted to make sure it is with [facility].

## 2017-12-30 NOTE — Telephone Encounter (Signed)
Patient advised, order is still active and resulting agency is [facility].KW

## 2018-01-05 ENCOUNTER — Ambulatory Visit: Payer: PRIVATE HEALTH INSURANCE

## 2018-01-13 ENCOUNTER — Other Ambulatory Visit: Payer: Self-pay | Admitting: Family Medicine

## 2018-01-13 DIAGNOSIS — G4700 Insomnia, unspecified: Secondary | ICD-10-CM

## 2018-01-25 ENCOUNTER — Encounter: Payer: Self-pay | Admitting: Family Medicine

## 2018-01-27 ENCOUNTER — Telehealth: Payer: Self-pay

## 2018-01-27 ENCOUNTER — Other Ambulatory Visit: Payer: Self-pay | Admitting: Family Medicine

## 2018-01-27 LAB — LIPID PANEL
Chol/HDL Ratio: 3.7 ratio (ref 0.0–4.4)
Cholesterol, Total: 184 mg/dL (ref 100–199)
HDL: 50 mg/dL (ref >39)
LDL Calculated: 107 mg/dL — ABNORMAL HIGH (ref 0–99)
Triglycerides: 136 mg/dL (ref 0–149)
VLDL CHOLESTEROL CAL: 27 mg/dL (ref 5–40)

## 2018-01-27 LAB — COMPREHENSIVE METABOLIC PANEL
ALT: 29 IU/L (ref 0–32)
AST: 23 IU/L (ref 0–40)
Albumin/Globulin Ratio: 1.8 (ref 1.2–2.2)
Albumin: 4.5 g/dL (ref 3.6–4.8)
Alkaline Phosphatase: 84 IU/L (ref 39–117)
BUN/Creatinine Ratio: 14 (ref 12–28)
BUN: 14 mg/dL (ref 8–27)
Bilirubin Total: 0.5 mg/dL (ref 0.0–1.2)
CO2: 28 mmol/L (ref 20–29)
Calcium: 10 mg/dL (ref 8.7–10.3)
Chloride: 96 mmol/L (ref 96–106)
Creatinine, Ser: 1.03 mg/dL — ABNORMAL HIGH (ref 0.57–1.00)
GFR calc non Af Amer: 58 mL/min/{1.73_m2} — ABNORMAL LOW (ref >59)
GFR, EST AFRICAN AMERICAN: 67 mL/min/{1.73_m2} (ref >59)
GLUCOSE: 98 mg/dL (ref 65–99)
Globulin, Total: 2.5 g/dL (ref 1.5–4.5)
Potassium: 3.4 mmol/L — ABNORMAL LOW (ref 3.5–5.2)
Sodium: 140 mmol/L (ref 134–144)
TOTAL PROTEIN: 7 g/dL (ref 6.0–8.5)

## 2018-01-27 LAB — HEPATITIS C ANTIBODY

## 2018-01-27 MED ORDER — POTASSIUM CHLORIDE CRYS ER 20 MEQ PO TBCR: 20.0000 meq | EXTENDED_RELEASE_TABLET | Freq: Every day | ORAL | 3 refills | Status: DC

## 2018-01-27 NOTE — Telephone Encounter (Signed)
Patient has been advised she states that she is taking pravastatin daily but has not adhered to low fat food choices, patient stats that she does eat a lot of cheese and will slowly cut back. KW

## 2018-01-27 NOTE — Telephone Encounter (Signed)
-----   Message from Carmon Ginsberg, [place] sent at 01/27/2018  7:33 AM EST ----- Cholesterol has come up a bit-are you taking the pravastatin daily and adhering (most of the time) to low fat food choices? Potassium is also a bit low due to the fluid pill. I send in a potassium supplement for you.

## 2018-02-01 ENCOUNTER — Encounter: Payer: Self-pay | Admitting: Family Medicine

## 2018-02-01 ENCOUNTER — Ambulatory Visit: Payer: PRIVATE HEALTH INSURANCE | Admitting: Family Medicine

## 2018-02-01 VITALS — BP 140/98 | HR 95 | Temp 98.5°F | Resp 16 | Wt 184.2 lb

## 2018-02-01 DIAGNOSIS — E782 Mixed hyperlipidemia: Secondary | ICD-10-CM | POA: Diagnosis not present

## 2018-02-01 DIAGNOSIS — I10 Essential (primary) hypertension: Secondary | ICD-10-CM

## 2018-02-01 MED ORDER — PRAVASTATIN SODIUM 80 MG PO TABS: 80.0000 mg | ORAL_TABLET | Freq: Every day | ORAL | 3 refills | Status: DC

## 2018-02-01 NOTE — Progress Notes (Signed)
Subjective:     Patient ID: Katrina Simpson, female   DOB: Apr 24, 1956, 62 y.o.   MRN: [id number] Chief Complaint  Patient presents with  . Hypertension    Patient returns to office today for 6 month follow up , last office visit was 05/11/17 patient blood pressure was118/70 she was encouraged to continue Chlorthalidone 25mg  qd. Patient reports good compliance and tolerance on medication.  . Hyperlipidemia    Patient returns for 6 month follow up, last office visit was 05/11/17. At last visit patient was encouraged to continue Atorvastatin 80mg  qd and Lipid panel was ordered. Patient reports poor diet and states that she is not actively exercising.    HPI States she currently has a cough associated with a cold.. States sx have been relieved by Delsym Reports she has not been exercising ( 30 minutes twice daily with a 10# weight gain since this time last year. Reports she is taking her last pravastatin today.  Review of Systems     Objective:   Physical Exam  Constitutional: She appears well-developed and well-nourished. No distress.  Cardiovascular: Normal rate and regular rhythm.  Pulmonary/Chest: Breath sounds normal.  Musculoskeletal: She exhibits no edema (of lower extremities).       Assessment:    1. Essential hypertension, benign: recheck after cold abates  2. Mixed hyperlipidemia - pravastatin (PRAVACHOL) 80 MG tablet; Take 1 tablet (80 mg total) by mouth daily.  Dispense: 90 tablet; Refill: 3    Plan:    Resume walking program and wiser dietary choices.

## 2018-02-01 NOTE — Patient Instructions (Signed)
Resume walking program and low fat dietary choices. Come in for a nurse bp check when you are better from your cold.

## 2018-02-10 ENCOUNTER — Encounter: Payer: Self-pay | Admitting: Family Medicine

## 2018-03-11 ENCOUNTER — Ambulatory Visit
Admission: RE | Admit: 2018-03-11 | Discharge: 2018-03-11 | Disposition: A | Discharge status: Home / Self Care | Payer: PRIVATE HEALTH INSURANCE | Source: Ambulatory Visit | Attending: Gynecology | Admitting: Gynecology

## 2018-03-11 DIAGNOSIS — Z1231 Encounter for screening mammogram for malignant neoplasm of breast: Secondary | ICD-10-CM

## 2018-03-15 ENCOUNTER — Ambulatory Visit (INDEPENDENT_AMBULATORY_CARE_PROVIDER_SITE_OTHER): Payer: PRIVATE HEALTH INSURANCE | Admitting: Women's Health

## 2018-03-15 ENCOUNTER — Encounter: Payer: Self-pay | Admitting: Women's Health

## 2018-03-15 VITALS — BP 130/80 | Ht 63.0 in | Wt 182.0 lb

## 2018-03-15 DIAGNOSIS — Z01419 Encounter for gynecological examination (general) (routine) without abnormal findings: Secondary | ICD-10-CM | POA: Diagnosis not present

## 2018-03-15 NOTE — Progress Notes (Signed)
Katrina Simpson [birth year] [id number]    History:    Presents for annual exam with no complaints. Postmenopausal on no HRT with no bleeding. Normal mammogram history.1996 LGSIL with normal Paps after.  2018 T score -2.1 at femoral neck FRAX 4.6% 0.6%. 12/2014 benign colon polyp/adenoma 3-year follow-up 2019.  Has colonoscopy appointment for April, 2019.  Primary care manage hypertension and hypercholesterolemia.  Past medical history, past surgical history, family history and social history were all reviewed and documented in the EPIC chart.  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  ROS:  A ROS was performed and pertinent positives and negatives are included.  Exam:  Vitals:   03/15/18 1444  BP: 130/80  Weight: 182 lb (82.6 kg)  Height: 5\' 3"  (1.6 m)   Body mass index is 32.24 kg/m.   General appearance:  Normal Thyroid:  Symmetrical, normal in size, without palpable masses or nodularity. Respiratory  Auscultation:  Clear without wheezing or rhonchi Cardiovascular  Auscultation:  Regular rate, without rubs, murmurs or gallops  Edema/varicosities:  Not grossly evident Abdominal  Soft,nontender, without masses, guarding or rebound.  Liver/spleen:  No organomegaly noted  Hernia:  None appreciated  Skin  Inspection:  Grossly normal   Breasts: Examined lying and sitting.     Right: Without masses, retractions, discharge or axillary adenopathy.     Left: Without masses, retractions, discharge or axillary adenopathy. Gentitourinary   Inguinal/mons:  Normal without inguinal adenopathy  External genitalia:  Normal  BUS/Urethra/Skene's glands:  Normal  Vagina:  Normal  Cervix:  Normal  Uterus: normal in size, shape and contour.  Midline and mobile  Adnexa/parametria:     Rt: Without masses or tenderness.   Lt: Without masses or tenderness.  Anus and perineum: Normal  Digital rectal exam: Normal sphincter tone without palpated masses or  tenderness  Assessment/Plan:  62 y.o. MWF G3P3 for annual exam no complaints.  Postmenopausal/no HRT/no bleeding Osteopenia without elevated FRAX  Hypertension/hypercholesterolemia-primary CARE itches labs and meds Numerous moles-has annual skin check  Plan: SBEs, continue annual screening mammogram..  Reviewed importance of home safety, fall prevention and importance of healthy diet, yoga, weightbearing exercise encouraged.  Calcium, potassium rich diet, vitamin D 2000 daily encouraged.     Phillips, 3:42 PM 03/15/2018

## 2018-03-15 NOTE — Patient Instructions (Signed)
shingrex vaccine  Health Maintenance for Postmenopausal Women Menopause is a normal process in which your reproductive ability comes to an end. This process happens gradually over a span of months to years, usually between the ages of 48 and 55. Menopause is complete when you have missed 12 consecutive menstrual periods. It is important to talk with your health care provider about some of the most common conditions that affect postmenopausal women, such as heart disease, cancer, and bone loss (osteoporosis). Adopting a healthy lifestyle and getting preventive care can help to promote your health and wellness. Those actions can also lower your chances of developing some of these common conditions. What should I know about menopause? During menopause, you may experience a number of symptoms, such as:  Moderate-to-severe hot flashes.  Night sweats.  Decrease in sex drive.  Mood swings.  Headaches.  Tiredness.  Irritability.  Memory problems.  Insomnia.  Choosing to treat or not to treat menopausal changes is an individual decision that you make with your health care provider. What should I know about hormone replacement therapy and supplements? Hormone therapy products are effective for treating symptoms that are associated with menopause, such as hot flashes and night sweats. Hormone replacement carries certain risks, especially as you become older. If you are thinking about using estrogen or estrogen with progestin treatments, discuss the benefits and risks with your health care provider. What should I know about heart disease and stroke? Heart disease, heart attack, and stroke become more likely as you age. This may be due, in part, to the hormonal changes that your body experiences during menopause. These can affect how your body processes dietary fats, triglycerides, and cholesterol. Heart attack and stroke are both medical emergencies. There are many things that you can do to help  prevent heart disease and stroke:  Have your blood pressure checked at least every 1-2 years. High blood pressure causes heart disease and increases the risk of stroke.  If you are 55-79 years old, ask your health care provider if you should take aspirin to prevent a heart attack or a stroke.  Do not use any tobacco products, including cigarettes, chewing tobacco, or electronic cigarettes. If you need help quitting, ask your health care provider.  It is important to eat a healthy diet and maintain a healthy weight. ? Be sure to include plenty of vegetables, fruits, low-fat dairy products, and lean protein. ? Avoid eating foods that are high in solid fats, added sugars, or salt (sodium).  Get regular exercise. This is one of the most important things that you can do for your health. ? Try to exercise for at least 150 minutes each week. The type of exercise that you do should increase your heart rate and make you sweat. This is known as moderate-intensity exercise. ? Try to do strengthening exercises at least twice each week. Do these in addition to the moderate-intensity exercise.  Know your numbers.Ask your health care provider to check your cholesterol and your blood glucose. Continue to have your blood tested as directed by your health care provider.  What should I know about cancer screening? There are several types of cancer. Take the following steps to reduce your risk and to catch any cancer development as early as possible. Breast Cancer  Practice breast self-awareness. ? This means understanding how your breasts normally appear and feel. ? It also means doing regular breast self-exams. Let your health care provider know about any changes, no matter how small.  If   If you are 20 or older, have a clinician do a breast exam (clinical breast exam or CBE) every year. Depending on your age, family history, and medical history, it may be recommended that you also have a yearly breast X-ray  (mammogram).  If you have a family history of breast cancer, talk with your health care provider about genetic screening.  If you are at high risk for breast cancer, talk with your health care provider about having an MRI and a mammogram every year.  Breast cancer (BRCA) gene test is recommended for women who have family members with BRCA-related cancers. Results of the assessment will determine the need for genetic counseling and BRCA1 and for BRCA2 testing. BRCA-related cancers include these types: ? Breast. This occurs in males or females. ? Ovarian. ? Tubal. This may also be called fallopian tube cancer. ? Cancer of the abdominal or pelvic lining (peritoneal cancer). ? Prostate. ? Pancreatic.  Cervical, Uterine, and Ovarian Cancer Your health care provider may recommend that you be screened regularly for cancer of the pelvic organs. These include your ovaries, uterus, and vagina. This screening involves a pelvic exam, which includes checking for microscopic changes to the surface of your cervix (Pap test).  For women ages 21-65, health care providers may recommend a pelvic exam and a Pap test every three years. For women ages 24-65, they may recommend the Pap test and pelvic exam, combined with testing for human papilloma virus (HPV), every five years. Some types of HPV increase your risk of cervical cancer. Testing for HPV may also be done on women of any age who have unclear Pap test results.  Other health care providers may not recommend any screening for nonpregnant women who are considered low risk for pelvic cancer and have no symptoms. Ask your health care provider if a screening pelvic exam is right for you.  If you have had past treatment for cervical cancer or a condition that could lead to cancer, you need Pap tests and screening for cancer for at least 20 years after your treatment. If Pap tests have been discontinued for you, your risk factors (such as having a new sexual  partner) need to be reassessed to determine if you should start having screenings again. Some women have medical problems that increase the chance of getting cervical cancer. In these cases, your health care provider may recommend that you have screening and Pap tests more often.  If you have a family history of uterine cancer or ovarian cancer, talk with your health care provider about genetic screening.  If you have vaginal bleeding after reaching menopause, tell your health care provider.  There are currently no reliable tests available to screen for ovarian cancer.  Lung Cancer Lung cancer screening is recommended for adults 71-49 years old who are at high risk for lung cancer because of a history of smoking. A yearly low-dose CT scan of the lungs is recommended if you:  Currently smoke.  Have a history of at least 30 pack-years of smoking and you currently smoke or have quit within the past 15 years. A pack-year is smoking an average of one pack of cigarettes per day for one year.  Yearly screening should:  Continue until it has been 15 years since you quit.  Stop if you develop a health problem that would prevent you from having lung cancer treatment.  Colorectal Cancer  This type of cancer can be detected and can often be prevented.  Routine colorectal cancer  screening usually begins at age 50 and continues through age 75.  If you have risk factors for colon cancer, your health care provider may recommend that you be screened at an earlier age.  If you have a family history of colorectal cancer, talk with your health care provider about genetic screening.  Your health care provider may also recommend using home test kits to check for hidden blood in your stool.  A small camera at the end of a tube can be used to examine your colon directly (sigmoidoscopy or colonoscopy). This is done to check for the earliest forms of colorectal cancer.  Direct examination of the colon  should be repeated every 5-10 years until age 75. However, if early forms of precancerous polyps or small growths are found or if you have a family history or genetic risk for colorectal cancer, you may need to be screened more often.  Skin Cancer  Check your skin from head to toe regularly.  Monitor any moles. Be sure to tell your health care provider: ? About any new moles or changes in moles, especially if there is a change in a mole's shape or color. ? If you have a mole that is larger than the size of a pencil eraser.  If any of your family members has a history of skin cancer, especially at a Katrina Simpson age, talk with your health care provider about genetic screening.  Always use sunscreen. Apply sunscreen liberally and repeatedly throughout the day.  Whenever you are outside, protect yourself by wearing long sleeves, pants, a wide-brimmed hat, and sunglasses.  What should I know about osteoporosis? Osteoporosis is a condition in which bone destruction happens more quickly than new bone creation. After menopause, you may be at an increased risk for osteoporosis. To help prevent osteoporosis or the bone fractures that can happen because of osteoporosis, the following is recommended:  If you are 19-50 years old, get at least 1,000 mg of calcium and at least 600 mg of vitamin D per day.  If you are older than age 50 but younger than age 70, get at least 1,200 mg of calcium and at least 600 mg of vitamin D per day.  If you are older than age 70, get at least 1,200 mg of calcium and at least 800 mg of vitamin D per day.  Smoking and excessive alcohol intake increase the risk of osteoporosis. Eat foods that are rich in calcium and vitamin D, and do weight-bearing exercises several times each week as directed by your health care provider. What should I know about how menopause affects my mental health? Depression may occur at any age, but it is more common as you become older. Common symptoms of  depression include:  Low or sad mood.  Changes in sleep patterns.  Changes in appetite or eating patterns.  Feeling an overall lack of motivation or enjoyment of activities that you previously enjoyed.  Frequent crying spells.  Talk with your health care provider if you think that you are experiencing depression. What should I know about immunizations? It is important that you get and maintain your immunizations. These include:  Tetanus, diphtheria, and pertussis (Tdap) booster vaccine.  Influenza every year before the flu season begins.  Pneumonia vaccine.  Shingles vaccine.  Your health care provider may also recommend other immunizations. This information is not intended to replace advice given to you by your health care provider. Make sure you discuss any questions you have with your health care   provider. Document Released: 02/05/2006 Document Revised: 07/03/2016 Document Reviewed: 09/17/2015 Elsevier Interactive Patient Education  2018 [employer].

## 2018-03-22 ENCOUNTER — Encounter: Payer: Self-pay | Admitting: Family Medicine

## 2018-03-23 ENCOUNTER — Other Ambulatory Visit: Payer: Self-pay | Admitting: Family Medicine

## 2018-03-23 ENCOUNTER — Encounter: Payer: Self-pay | Admitting: Family Medicine

## 2018-03-23 MED ORDER — FLUOCINONIDE 0.05 % EX CREA: 1.0000 "application " | TOPICAL_CREAM | Freq: Three times a day (TID) | CUTANEOUS | 0 refills | Status: DC

## 2018-03-24 ENCOUNTER — Ambulatory Visit (INDEPENDENT_AMBULATORY_CARE_PROVIDER_SITE_OTHER): Payer: PRIVATE HEALTH INSURANCE | Admitting: Family Medicine

## 2018-03-24 ENCOUNTER — Encounter: Payer: Self-pay | Admitting: Family Medicine

## 2018-03-24 VITALS — BP 122/80 | HR 80 | Temp 97.6°F | Resp 16 | Wt 184.0 lb

## 2018-03-24 DIAGNOSIS — L237 Allergic contact dermatitis due to plants, except food: Secondary | ICD-10-CM | POA: Diagnosis not present

## 2018-03-24 MED ORDER — PREDNISONE 20 MG PO TABS: ORAL_TABLET | ORAL | 0 refills | Status: DC

## 2018-03-24 NOTE — Patient Instructions (Signed)
Keep rash clean with soap and water. 

## 2018-03-24 NOTE — Progress Notes (Signed)
Subjective:     Patient ID: Esperanza Sheets, female   DOB: 03-06-1956, 62 y.o.   MRN: [id number] Chief Complaint  Patient presents with  . Rash    Patient comes into office today with concerns of possible poison oak/ivy exposure after working in woods this weekened. Patient reports that 4 days ago rash appeared on her arms and is now spreading to face and back of neck. Patient was prescribed Lidex cream in the past 24hrs but states she has had no relief    HPI Has tried rx topical steroid with minimal  relief  Review of Systems     Objective:   Physical Exam  Constitutional: She appears well-developed and well-nourished. No distress.  Skin:  Left arm with several erythematous patches. righ arm with papules in a linear pattern. Neck with two discrete patches. No bullae. Minimal vesicle formation.       Assessment:    1. Allergic contact dermatitis due to plants, except food - predniSONE (DELTASONE) 20 MG tablet; Taper as follows: 3 pills for 4 days, two pills for 4 days, one pill for four days  Dispense: 24 tablet; Refill: 0    Plan:    Keep areas clean with soap and water. Discussed side effects of prednisone

## 2018-04-26 ENCOUNTER — Encounter: Payer: Self-pay | Admitting: Family Medicine

## 2018-04-27 ENCOUNTER — Other Ambulatory Visit: Payer: Self-pay | Admitting: Family Medicine

## 2018-04-27 DIAGNOSIS — Z111 Encounter for screening for respiratory tuberculosis: Secondary | ICD-10-CM

## 2018-07-11 ENCOUNTER — Other Ambulatory Visit: Payer: Self-pay | Admitting: Family Medicine

## 2018-07-11 DIAGNOSIS — I10 Essential (primary) hypertension: Secondary | ICD-10-CM

## 2018-07-11 DIAGNOSIS — G4700 Insomnia, unspecified: Secondary | ICD-10-CM

## 2018-07-25 ENCOUNTER — Encounter: Payer: Self-pay | Admitting: Family Medicine

## 2018-08-15 ENCOUNTER — Encounter: Payer: Self-pay | Admitting: Family Medicine

## 2018-09-22 ENCOUNTER — Emergency Department
Admission: EM | Admit: 2018-09-22 | Discharge: 2018-09-22 | Disposition: A | Discharge status: Home / Self Care | Payer: PRIVATE HEALTH INSURANCE | Attending: Emergency Medicine | Admitting: Emergency Medicine

## 2018-09-22 DIAGNOSIS — Z5321 Procedure and treatment not carried out due to patient leaving prior to being seen by health care provider: Secondary | ICD-10-CM | POA: Diagnosis not present

## 2018-09-22 DIAGNOSIS — K6289 Other specified diseases of anus and rectum: Secondary | ICD-10-CM | POA: Insufficient documentation

## 2018-09-22 NOTE — ED Notes (Signed)
Pt has decided to go home. Pt states her her pain has gone away since arrival to ED she will treat her constipation at home and if her pain returns she will come back to ED or call her pcp.

## 2018-09-22 NOTE — ED Triage Notes (Signed)
Pt presents to ED with hemorrhoid pain with hx of the same. Pt states she has been constipated all week and was concerned due to the pain her hemorrhoid has been causing today. Pt states her pain "is a Lot better since leaving home."  Pt states she wants to go home and get a fleets enema since her pain has subsided. Not sure if she will stay.

## 2018-10-08 ENCOUNTER — Ambulatory Visit (INDEPENDENT_AMBULATORY_CARE_PROVIDER_SITE_OTHER): Payer: PRIVATE HEALTH INSURANCE

## 2018-10-08 DIAGNOSIS — Z23 Encounter for immunization: Secondary | ICD-10-CM

## 2018-12-29 ENCOUNTER — Encounter: Payer: Self-pay | Admitting: Family Medicine

## 2018-12-29 ENCOUNTER — Ambulatory Visit (INDEPENDENT_AMBULATORY_CARE_PROVIDER_SITE_OTHER): Payer: PRIVATE HEALTH INSURANCE | Admitting: Family Medicine

## 2018-12-29 ENCOUNTER — Other Ambulatory Visit: Payer: Self-pay

## 2018-12-29 VITALS — BP 136/92 | HR 94 | Temp 98.6°F | Ht 64.0 in | Wt 173.4 lb

## 2018-12-29 DIAGNOSIS — I10 Essential (primary) hypertension: Secondary | ICD-10-CM | POA: Diagnosis not present

## 2018-12-29 DIAGNOSIS — E782 Mixed hyperlipidemia: Secondary | ICD-10-CM

## 2018-12-29 DIAGNOSIS — B349 Viral infection, unspecified: Secondary | ICD-10-CM

## 2018-12-29 NOTE — Patient Instructions (Signed)
Discussed use of Saline irrigation and Mucinex for congestion. Delsym for cough.

## 2018-12-29 NOTE — Progress Notes (Signed)
  Subjective:     Patient ID: Katrina Simpson, female   DOB: 10-31-1956, 63 y.o.   MRN: [id number] Chief Complaint  Patient presents with  . Sore Throat    sneezing, coughing alot, not much fever but didn't take it, weakness, headaches, body aches, she took nyquil   HPI Developed viral sx 5 days ago. Has been taking Nyquil with little relief. + flu shot  Review of Systems     Objective:   Physical Exam Constitutional:      General: She is not in acute distress.    Appearance: She is well-developed. She is ill-appearing.  Neurological:     Mental Status: She is alert.   Ears: T.M's intact without inflammation Sinuses: non-tender Throat: no tonsillar enlargement or exudate Neck: no cervical adenopathy Lungs: clear     Assessment:    1. Essential hypertension, benign - Comprehensive metabolic panel  2. Mixed hyperlipidemia - Lipid panel  3. Viral syndrome    Plan:    Discussed otc medication and natural hx of her illness. Work excuse for 12/31-12/30/17.

## 2019-01-18 ENCOUNTER — Other Ambulatory Visit: Payer: Self-pay | Admitting: Family Medicine

## 2019-01-18 DIAGNOSIS — G4700 Insomnia, unspecified: Secondary | ICD-10-CM

## 2019-01-26 ENCOUNTER — Other Ambulatory Visit: Payer: Self-pay | Admitting: Family Medicine

## 2019-01-27 LAB — COMPREHENSIVE METABOLIC PANEL
A/G RATIO: 1.5 (ref 1.2–2.2)
ALK PHOS: 91 IU/L (ref 39–117)
ALT: 24 IU/L (ref 0–32)
AST: 21 IU/L (ref 0–40)
Albumin: 4.3 g/dL (ref 3.8–4.8)
BUN/Creatinine Ratio: 12 (ref 12–28)
BUN: 12 mg/dL (ref 8–27)
Bilirubin Total: 0.4 mg/dL (ref 0.0–1.2)
CALCIUM: 9.7 mg/dL (ref 8.7–10.3)
CHLORIDE: 97 mmol/L (ref 96–106)
CO2: 27 mmol/L (ref 20–29)
Creatinine, Ser: 1.04 mg/dL — ABNORMAL HIGH (ref 0.57–1.00)
GFR calc Af Amer: 66 mL/min/{1.73_m2} (ref >59)
GFR, EST NON AFRICAN AMERICAN: 57 mL/min/{1.73_m2} — AB (ref >59)
GLOBULIN, TOTAL: 2.9 g/dL (ref 1.5–4.5)
Glucose: 91 mg/dL (ref 65–99)
POTASSIUM: 3.9 mmol/L (ref 3.5–5.2)
SODIUM: 140 mmol/L (ref 134–144)
Total Protein: 7.2 g/dL (ref 6.0–8.5)

## 2019-01-27 LAB — LIPID PANEL
CHOLESTEROL TOTAL: 183 mg/dL (ref 100–199)
Chol/HDL Ratio: 3.6 ratio (ref 0.0–4.4)
HDL: 51 mg/dL (ref >39)
LDL Calculated: 112 mg/dL — ABNORMAL HIGH (ref 0–99)
TRIGLYCERIDES: 100 mg/dL (ref 0–149)
VLDL Cholesterol Cal: 20 mg/dL (ref 5–40)

## 2019-02-03 DIAGNOSIS — G5603 Carpal tunnel syndrome, bilateral upper limbs: Secondary | ICD-10-CM | POA: Insufficient documentation

## 2019-02-03 DIAGNOSIS — M180 Bilateral primary osteoarthritis of first carpometacarpal joints: Secondary | ICD-10-CM | POA: Insufficient documentation

## 2019-02-27 ENCOUNTER — Other Ambulatory Visit: Payer: Self-pay | Admitting: Family Medicine

## 2019-02-27 DIAGNOSIS — E782 Mixed hyperlipidemia: Secondary | ICD-10-CM

## 2019-02-27 MED ORDER — PRAVASTATIN SODIUM 80 MG PO TABS: 80.0000 mg | ORAL_TABLET | Freq: Every day | ORAL | 3 refills | Status: DC

## 2019-02-27 NOTE — Telephone Encounter (Signed)
Patient of [facility] please review and advise. KW

## 2019-03-20 ENCOUNTER — Encounter: Payer: PRIVATE HEALTH INSURANCE | Admitting: Women's Health

## 2019-04-14 ENCOUNTER — Other Ambulatory Visit: Payer: Self-pay

## 2019-04-17 ENCOUNTER — Other Ambulatory Visit: Payer: Self-pay

## 2019-04-17 ENCOUNTER — Ambulatory Visit (INDEPENDENT_AMBULATORY_CARE_PROVIDER_SITE_OTHER): Payer: PRIVATE HEALTH INSURANCE | Admitting: Women's Health

## 2019-04-17 ENCOUNTER — Encounter: Payer: Self-pay | Admitting: Women's Health

## 2019-04-17 VITALS — BP 126/78 | Ht 64.0 in | Wt 179.0 lb

## 2019-04-17 DIAGNOSIS — Z13820 Encounter for screening for osteoporosis: Secondary | ICD-10-CM

## 2019-04-17 DIAGNOSIS — N898 Other specified noninflammatory disorders of vagina: Secondary | ICD-10-CM | POA: Diagnosis not present

## 2019-04-17 DIAGNOSIS — Z780 Asymptomatic menopausal state: Secondary | ICD-10-CM

## 2019-04-17 DIAGNOSIS — Z01419 Encounter for gynecological examination (general) (routine) without abnormal findings: Secondary | ICD-10-CM | POA: Diagnosis not present

## 2019-04-17 DIAGNOSIS — M810 Age-related osteoporosis without current pathological fracture: Secondary | ICD-10-CM

## 2019-04-17 LAB — WET PREP FOR TRICH, YEAST, CLUE

## 2019-04-17 NOTE — Progress Notes (Signed)
Katrina Simpson October 05, 1956 [id number]    History:    Presents for annual exam.  Postmenopausal no HRT with no bleeding.  1996 LGSIL with normal Paps after.  Normal mammogram history.  2018 T score -2.1 FRAX 4.6% / 0.6%.  2016 benign colon adenoma 3-year follow-up 2019-, no polyps.  Primary care manages hypertension and hypercholesteremia.  Past medical history, past surgical history, family history and social history were all reviewed and documented in the EPIC chart.  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  14 grandchildren all doing well.  ROS:  A ROS was performed and pertinent positives and negatives are included.  Exam:  Vitals:   04/17/19 1112  BP: 126/78  Weight: 179 lb (81.2 kg)  Height: 5\' 4"  (1.626 m)   Body mass index is 30.73 kg/m.   General appearance:  Normal Thyroid:  Symmetrical, normal in size, without palpable masses or nodularity. Respiratory  Auscultation:  Clear without wheezing or rhonchi Cardiovascular  Auscultation:  Regular rate, without rubs, murmurs or gallops  Edema/varicosities:  Not grossly evident Abdominal  Soft,nontender, without masses, guarding or rebound.  Liver/spleen:  No organomegaly noted  Hernia:  None appreciated  Skin  Inspection:  Grossly normal   Breasts: Examined lying and sitting.     Right: Without masses, retractions, discharge or axillary adenopathy.     Left: Without masses, retractions, discharge or axillary adenopathy. Gentitourinary   Inguinal/mons:  Normal without inguinal adenopathy  External genitalia: Multiple sebaceous cysts nontender, no change  BUS/Urethra/Skene's glands:  Normal  Vagina: Mild erythema scant white discharge, wet prep negative              Cervix:  Normal  Uterus:  normal in size, shape and contour.  Midline and mobile  Adnexa/parametria:     Rt: Without masses or tenderness.   Lt: Without masses or tenderness.  Anus and perineum: Normal  Digital rectal  exam: Normal sphincter tone without palpated masses or tenderness  Assessment/Plan:  63 y.o. MWF G3, P4 for annual exam no complaints  Postmenopausal on no HRT with no bleeding 1996 LGSIL with normal Paps after Osteopenia without elevated FRAX Hypertension, hypercholesteremia-primary care manages labs and meds Obesity  Plan: SBEs, continue annual screening mammogram, calcium rich foods, vitamin D 2000 daily,  decrease calories/carbs encouraged.  Repeat DEXA.  Reviewed importance of weightbearing and balance type exercise, yoga encouraged.  Home safety, fall prevention discussed.   Shingrex reviewed and encouraged, Pneumovax at age 21.  Pap normal with negative HR HPV 2018, new screening guidelines reviewed.      Huel Cote [facility], 11:22 AM 04/17/2019

## 2019-04-17 NOTE — Patient Instructions (Addendum)
Repeat DEXA Shingrex 2 series shot can be obtained at local pharmacy for shingles prevention Pneumovax at age 65 at primary care Vitamin D3  2000 iu daily   Health Maintenance for Postmenopausal Women Menopause is a normal process in which your reproductive ability comes to an end. This process happens gradually over a span of months to years, usually between the ages of 51 and 28. Menopause is complete when you have missed 12 consecutive menstrual periods. It is important to talk with your health care provider about some of the most common conditions that affect postmenopausal women, such as heart disease, cancer, and bone loss (osteoporosis). Adopting a healthy lifestyle and getting preventive care can help to promote your health and wellness. Those actions can also lower your chances of developing some of these common conditions. What should I know about menopause? During menopause, you may experience a number of symptoms, such as:  Moderate-to-severe hot flashes.  Night sweats.  Decrease in sex drive.  Mood swings.  Headaches.  Tiredness.  Irritability.  Memory problems.  Insomnia. Choosing to treat or not to treat menopausal changes is an individual decision that you make with your health care provider. What should I know about hormone replacement therapy and supplements? Hormone therapy products are effective for treating symptoms that are associated with menopause, such as hot flashes and night sweats. Hormone replacement carries certain risks, especially as you become older. If you are thinking about using estrogen or estrogen with progestin treatments, discuss the benefits and risks with your health care provider. What should I know about heart disease and stroke? Heart disease, heart attack, and stroke become more likely as you age. This may be due, in part, to the hormonal changes that your body experiences during menopause. These can affect how your body processes dietary  fats, triglycerides, and cholesterol. Heart attack and stroke are both medical emergencies. There are many things that you can do to help prevent heart disease and stroke:  Have your blood pressure checked at least every 1-2 years. High blood pressure causes heart disease and increases the risk of stroke.  If you are 74-27 years old, ask your health care provider if you should take aspirin to prevent a heart attack or a stroke.  Do not use any tobacco products, including cigarettes, chewing tobacco, or electronic cigarettes. If you need help quitting, ask your health care provider.  It is important to eat a healthy diet and maintain a healthy weight. ? Be sure to include plenty of vegetables, fruits, low-fat dairy products, and lean protein. ? Avoid eating foods that are high in solid fats, added sugars, or salt (sodium).  Get regular exercise. This is one of the most important things that you can do for your health. ? Try to exercise for at least 150 minutes each week. The type of exercise that you do should increase your heart rate and make you sweat. This is known as moderate-intensity exercise. ? Try to do strengthening exercises at least twice each week. Do these in addition to the moderate-intensity exercise.  Know your numbers.Ask your health care provider to check your cholesterol and your blood glucose. Continue to have your blood tested as directed by your health care provider.  What should I know about cancer screening? There are several types of cancer. Take the following steps to reduce your risk and to catch any cancer development as early as possible. Breast Cancer  Practice breast self-awareness. ? This means understanding how your breasts normally  appear and feel. ? It also means doing regular breast self-exams. Let your health care provider know about any changes, no matter how small.  If you are 73 or older, have a clinician do a breast exam (clinical breast exam or  CBE) every year. Depending on your age, family history, and medical history, it may be recommended that you also have a yearly breast X-ray (mammogram).  If you have a family history of breast cancer, talk with your health care provider about genetic screening.  If you are at high risk for breast cancer, talk with your health care provider about having an MRI and a mammogram every year.  Breast cancer (BRCA) gene test is recommended for women who have family members with BRCA-related cancers. Results of the assessment will determine the need for genetic counseling and BRCA1 and for BRCA2 testing. BRCA-related cancers include these types: ? Breast. This occurs in males or females. ? Ovarian. ? Tubal. This may also be called fallopian tube cancer. ? Cancer of the abdominal or pelvic lining (peritoneal cancer). ? Prostate. ? Pancreatic. Cervical, Uterine, and Ovarian Cancer Your health care provider may recommend that you be screened regularly for cancer of the pelvic organs. These include your ovaries, uterus, and vagina. This screening involves a pelvic exam, which includes checking for microscopic changes to the surface of your cervix (Pap test).  For women ages 21-65, health care providers may recommend a pelvic exam and a Pap test every three years. For women ages 60-65, they may recommend the Pap test and pelvic exam, combined with testing for human papilloma virus (HPV), every five years. Some types of HPV increase your risk of cervical cancer. Testing for HPV may also be done on women of any age who have unclear Pap test results.  Other health care providers may not recommend any screening for nonpregnant women who are considered low risk for pelvic cancer and have no symptoms. Ask your health care provider if a screening pelvic exam is right for you.  If you have had past treatment for cervical cancer or a condition that could lead to cancer, you need Pap tests and screening for cancer for  at least 20 years after your treatment. If Pap tests have been discontinued for you, your risk factors (such as having a new sexual partner) need to be reassessed to determine if you should start having screenings again. Some women have medical problems that increase the chance of getting cervical cancer. In these cases, your health care provider may recommend that you have screening and Pap tests more often.  If you have a family history of uterine cancer or ovarian cancer, talk with your health care provider about genetic screening.  If you have vaginal bleeding after reaching menopause, tell your health care provider.  There are currently no reliable tests available to screen for ovarian cancer. Lung Cancer Lung cancer screening is recommended for adults 55-70 years old who are at high risk for lung cancer because of a history of smoking. A yearly low-dose CT scan of the lungs is recommended if you:  Currently smoke.  Have a history of at least 30 pack-years of smoking and you currently smoke or have quit within the past 15 years. A pack-year is smoking an average of one pack of cigarettes per day for one year. Yearly screening should:  Continue until it has been 15 years since you quit.  Stop if you develop a health problem that would prevent you from having lung  cancer treatment. Colorectal Cancer  This type of cancer can be detected and can often be prevented.  Routine colorectal cancer screening usually begins at age 2 and continues through age 58.  If you have risk factors for colon cancer, your health care provider may recommend that you be screened at an earlier age.  If you have a family history of colorectal cancer, talk with your health care provider about genetic screening.  Your health care provider may also recommend using home test kits to check for hidden blood in your stool.  A small camera at the end of a tube can be used to examine your colon directly  (sigmoidoscopy or colonoscopy). This is done to check for the earliest forms of colorectal cancer.  Direct examination of the colon should be repeated every 5-10 years until age 4. However, if early forms of precancerous polyps or small growths are found or if you have a family history or genetic risk for colorectal cancer, you may need to be screened more often. Skin Cancer  Check your skin from head to toe regularly.  Monitor any moles. Be sure to tell your health care provider: ? About any new moles or changes in moles, especially if there is a change in a mole's shape or color. ? If you have a mole that is larger than the size of a pencil eraser.  If any of your family members has a history of skin cancer, especially at a Yahira Timberman age, talk with your health care provider about genetic screening.  Always use sunscreen. Apply sunscreen liberally and repeatedly throughout the day.  Whenever you are outside, protect yourself by wearing long sleeves, pants, a wide-brimmed hat, and sunglasses. What should I know about osteoporosis? Osteoporosis is a condition in which bone destruction happens more quickly than new bone creation. After menopause, you may be at an increased risk for osteoporosis. To help prevent osteoporosis or the bone fractures that can happen because of osteoporosis, the following is recommended:  If you are 42-54 years old, get at least 1,000 mg of calcium and at least 600 mg of vitamin D per day.  If you are older than age 64 but younger than age 85, get at least 1,200 mg of calcium and at least 600 mg of vitamin D per day.  If you are older than age 73, get at least 1,200 mg of calcium and at least 800 mg of vitamin D per day. Smoking and excessive alcohol intake increase the risk of osteoporosis. Eat foods that are rich in calcium and vitamin D, and do weight-bearing exercises several times each week as directed by your health care provider. What should I know about how  menopause affects my mental health? Depression may occur at any age, but it is more common as you become older. Common symptoms of depression include:  Low or sad mood.  Changes in sleep patterns.  Changes in appetite or eating patterns.  Feeling an overall lack of motivation or enjoyment of activities that you previously enjoyed.  Frequent crying spells. Talk with your health care provider if you think that you are experiencing depression. What should I know about immunizations? It is important that you get and maintain your immunizations. These include:  Tetanus, diphtheria, and pertussis (Tdap) booster vaccine.  Influenza every year before the flu season begins.  Pneumonia vaccine.  Shingles vaccine. Your health care provider may also recommend other immunizations. This information is not intended to replace advice given to you by  your health care provider. Make sure you discuss any questions you have with your health care provider. Document Released: 02/05/2006 Document Revised: 07/03/2016 Document Reviewed: 09/17/2015 Elsevier Interactive Patient Education  2019 [employer].

## 2019-08-01 ENCOUNTER — Other Ambulatory Visit: Payer: Self-pay

## 2019-08-01 ENCOUNTER — Other Ambulatory Visit: Payer: Self-pay | Admitting: Family Medicine

## 2019-08-01 DIAGNOSIS — G4700 Insomnia, unspecified: Secondary | ICD-10-CM

## 2019-08-01 MED ORDER — NORTRIPTYLINE HCL 10 MG PO CAPS: 10.0000 mg | ORAL_CAPSULE | Freq: Every day | ORAL | 0 refills | Status: DC

## 2019-08-01 NOTE — Telephone Encounter (Signed)
We can send in a 1 month supply (Take 1 tab qhs prn insomnia). She is due for HTN follow-up and should establish with new provider in the practice

## 2019-08-01 NOTE — Telephone Encounter (Signed)
Patient is asking for a refill on Nortriptyline 10 mg  to [facility] (old pt of [facility])

## 2019-08-21 ENCOUNTER — Encounter: Payer: Self-pay | Admitting: Family Medicine

## 2019-08-21 DIAGNOSIS — G4700 Insomnia, unspecified: Secondary | ICD-10-CM

## 2019-08-21 DIAGNOSIS — I10 Essential (primary) hypertension: Secondary | ICD-10-CM

## 2019-08-21 MED ORDER — NORTRIPTYLINE HCL 10 MG PO CAPS: 30.0000 mg | ORAL_CAPSULE | Freq: Every day | ORAL | 0 refills | Status: DC

## 2019-08-21 MED ORDER — CHLORTHALIDONE 25 MG PO TABS: 25.0000 mg | ORAL_TABLET | Freq: Every day | ORAL | 0 refills | Status: DC

## 2019-08-23 ENCOUNTER — Ambulatory Visit: Payer: Self-pay | Admitting: Family Medicine

## 2019-08-31 ENCOUNTER — Encounter: Payer: Self-pay | Admitting: Family Medicine

## 2019-08-31 ENCOUNTER — Ambulatory Visit: Payer: PRIVATE HEALTH INSURANCE | Admitting: Family Medicine

## 2019-08-31 ENCOUNTER — Other Ambulatory Visit: Payer: Self-pay

## 2019-08-31 VITALS — BP 129/82 | HR 78 | Temp 95.9°F | Wt 178.8 lb

## 2019-08-31 DIAGNOSIS — Z23 Encounter for immunization: Secondary | ICD-10-CM

## 2019-08-31 DIAGNOSIS — I10 Essential (primary) hypertension: Secondary | ICD-10-CM

## 2019-08-31 DIAGNOSIS — G5603 Carpal tunnel syndrome, bilateral upper limbs: Secondary | ICD-10-CM

## 2019-08-31 DIAGNOSIS — E669 Obesity, unspecified: Secondary | ICD-10-CM | POA: Insufficient documentation

## 2019-08-31 DIAGNOSIS — E782 Mixed hyperlipidemia: Secondary | ICD-10-CM

## 2019-08-31 DIAGNOSIS — E663 Overweight: Secondary | ICD-10-CM | POA: Insufficient documentation

## 2019-08-31 DIAGNOSIS — G44229 Chronic tension-type headache, not intractable: Secondary | ICD-10-CM

## 2019-08-31 DIAGNOSIS — G4700 Insomnia, unspecified: Secondary | ICD-10-CM | POA: Diagnosis not present

## 2019-08-31 DIAGNOSIS — Z6830 Body mass index (BMI) 30.0-30.9, adult: Secondary | ICD-10-CM

## 2019-08-31 MED ORDER — PRAVASTATIN SODIUM 80 MG PO TABS: 80.0000 mg | ORAL_TABLET | Freq: Every day | ORAL | 3 refills | Status: DC

## 2019-08-31 MED ORDER — NORTRIPTYLINE HCL 10 MG PO CAPS: 30.0000 mg | ORAL_CAPSULE | Freq: Every day | ORAL | 3 refills | Status: DC

## 2019-08-31 MED ORDER — CHLORTHALIDONE 25 MG PO TABS: 25.0000 mg | ORAL_TABLET | Freq: Every day | ORAL | 3 refills | Status: DC

## 2019-08-31 NOTE — Assessment & Plan Note (Signed)
Discussed importance of healthy weight management Discussed diet and exercise  

## 2019-08-31 NOTE — Assessment & Plan Note (Signed)
Followed by Hand surgeon Considering hand surgery

## 2019-08-31 NOTE — Assessment & Plan Note (Signed)
Well controlled Non-migranous Continue nortriptyline at current dose

## 2019-08-31 NOTE — Assessment & Plan Note (Signed)
Previously well controlled Reviewed last lipid panel Continue Pravastatin

## 2019-08-31 NOTE — Assessment & Plan Note (Signed)
Well controlled Continue current medications Recheck metabolic panel F/u in 6 months  

## 2019-08-31 NOTE — Progress Notes (Signed)
Patient: Katrina Simpson Female    DOB: 01/29/1956   63 y.o.   MRN: [id number] Visit Date: 08/31/2019  Today's Provider: Lavon Paganini, MD   Chief Complaint  Patient presents with  . Hypertension  . Hyperlipidemia   Subjective:     HPI  Hypertension, follow-up:  BP Readings from Last 3 Encounters:  08/31/19 129/82  04/17/19 126/78  12/29/18 (!) 136/92    She was last seen for hypertension 8 months ago.  BP at that visit was 136/92. Management since that visit includes no changes.She reports good compliance with treatment. She is not having side effects.  She is not exercising. She is adherent to low salt diet.   Outside blood pressures are not checked. She is experiencing none.  Patient denies chest pain, chest pressure/discomfort, claudication, dyspnea, exertional chest pressure/discomfort, fatigue, irregular heart beat, lower extremity edema, near-syncope, orthopnea, palpitations, paroxysmal nocturnal dyspnea, syncope and tachypnea.   Cardiovascular risk factors include advanced age (older than 12 for men, 40 for women), dyslipidemia and hypertension.  Use of agents associated with hypertension: none.   ------------------------------------------------------------------------    Lipid/Cholesterol, Follow-up:   Last seen for this 8 months ago.  Management since that visit includes no changes.  Last Lipid Panel:    Component Value Date/Time   CHOL 183 01/26/2019 0000   TRIG 100 01/26/2019 0000   HDL 51 01/26/2019 0000   CHOLHDL 3.6 01/26/2019 0000   LDLCALC 112 (H) 01/26/2019 0000    She reports good compliance with treatment. She is not having side effects.   Wt Readings from Last 3 Encounters:  08/31/19 178 lb 12.8 oz (81.1 kg)  04/17/19 179 lb (81.2 kg)  12/29/18 173 lb 6.4 oz (78.7 kg)   ------------------------------------------------------------------------    Allergies  Allergen Reactions  . Macrobid [Nitrofurantoin Macrocrystal]   .  Penicillins Swelling  . Sulfa Antibiotics Itching     Current Outpatient Medications:  .  chlorthalidone (HYGROTON) 25 MG tablet, Take 1 tablet (25 mg total) by mouth daily., Disp: 90 tablet, Rfl: 3 .  ibuprofen (ADVIL,MOTRIN) 200 MG tablet, Take 200-400 mg by mouth every 6 (six) hours as needed for headache or moderate pain., Disp: , Rfl:  .  nortriptyline (PAMELOR) 10 MG capsule, Take 3 capsules (30 mg total) by mouth at bedtime., Disp: 270 capsule, Rfl: 3 .  omeprazole (PRILOSEC OTC) 20 MG tablet, , Disp: , Rfl:  .  pravastatin (PRAVACHOL) 80 MG tablet, Take 1 tablet (80 mg total) by mouth daily., Disp: 90 tablet, Rfl: 3  Review of Systems  Constitutional: Negative.   Respiratory: Negative.   Cardiovascular: Negative.   Musculoskeletal: Negative.     Social History   Tobacco Use  . Smoking status: Never Smoker  . Smokeless tobacco: Never Used  Substance Use Topics  . Alcohol use: No      Objective:   BP 129/82 (BP Location: Left Arm, Patient Position: Sitting, Cuff Size: Normal)   Pulse 78   Temp (!) 95.9 F (35.5 C) (Temporal)   Wt 178 lb 12.8 oz (81.1 kg)   LMP 12/28/1998   SpO2 99%   BMI 30.69 kg/m  Vitals:   08/31/19 1534  BP: 129/82  Pulse: 78  Temp: (!) 95.9 F (35.5 C)  TempSrc: Temporal  SpO2: 99%  Weight: 178 lb 12.8 oz (81.1 kg)  Body mass index is 30.69 kg/m.   Physical Exam Vitals signs reviewed.  Constitutional:      General: She is  not in acute distress.    Appearance: Normal appearance. She is well-developed. She is not diaphoretic.  HENT:     Head: Normocephalic and atraumatic.     Right Ear: External ear normal.     Left Ear: External ear normal.  Eyes:     General: No scleral icterus.    Conjunctiva/sclera: Conjunctivae normal.  Neck:     Musculoskeletal: Neck supple.     Thyroid: No thyromegaly.  Cardiovascular:     Rate and Rhythm: Normal rate and regular rhythm.     Pulses: Normal pulses.     Heart sounds: Normal heart  sounds. No murmur.  Pulmonary:     Effort: Pulmonary effort is normal. No respiratory distress.     Breath sounds: Normal breath sounds. No wheezing or rales.  Abdominal:     General: There is no distension.     Palpations: Abdomen is soft.     Tenderness: There is no abdominal tenderness.  Musculoskeletal:     Right lower leg: No edema.     Left lower leg: No edema.  Lymphadenopathy:     Cervical: No cervical adenopathy.  Skin:    General: Skin is warm and dry.     Capillary Refill: Capillary refill takes less than 2 seconds.     Findings: No rash.  Neurological:     Mental Status: She is alert and oriented to person, place, and time. Mental status is at baseline.  Psychiatric:        Mood and Affect: Mood normal.        Behavior: Behavior normal.        Thought Content: Thought content normal.      No results found for any visits on 08/31/19.     Assessment & Plan   Problem List Items Addressed This Visit      Cardiovascular and Mediastinum   Essential hypertension, benign - Primary    Well controlled Continue current medications Recheck metabolic panel F/u in 6 months       Relevant Medications   pravastatin (PRAVACHOL) 80 MG tablet   chlorthalidone (HYGROTON) 25 MG tablet   Other Relevant Orders   Basic Metabolic Panel (BMET)     Nervous and Auditory   Bilateral carpal tunnel syndrome    Followed by Hand surgeon Considering hand surgery      Relevant Medications   nortriptyline (PAMELOR) 10 MG capsule     Other   Hyperlipidemia    Previously well controlled Reviewed last lipid panel Continue Pravastatin      Relevant Medications   pravastatin (PRAVACHOL) 80 MG tablet   chlorthalidone (HYGROTON) 25 MG tablet   Chronic headache    Well controlled Non-migranous Continue nortriptyline at current dose      Relevant Medications   nortriptyline (PAMELOR) 10 MG capsule   Obesity    Discussed importance of healthy weight management Discussed  diet and exercise        Other Visit Diagnoses    Need for influenza vaccination       Relevant Orders   Flu Vaccine QUAD 36+ mos IM (Completed)   Insomnia, unspecified type       Relevant Medications   nortriptyline (PAMELOR) 10 MG capsule       Return in about 6 months (around 02/28/2020) for CPE.   The entirety of the information documented in the History of Present Illness, Review of Systems and Physical Exam were personally obtained by me. Portions of this information were  initially documented by Tiburcio Pea, CMA and reviewed by me for thoroughness and accuracy.    Rolan Wrightsman, Dionne Bucy, MD MPH [facility]

## 2019-09-01 ENCOUNTER — Telehealth: Payer: Self-pay

## 2019-09-01 LAB — BASIC METABOLIC PANEL
BUN/Creatinine Ratio: 18 (ref 12–28)
BUN: 17 mg/dL (ref 8–27)
CO2: 26 mmol/L (ref 20–29)
Calcium: 10.4 mg/dL — ABNORMAL HIGH (ref 8.7–10.3)
Chloride: 96 mmol/L (ref 96–106)
Creatinine, Ser: 0.97 mg/dL (ref 0.57–1.00)
GFR calc Af Amer: 71 mL/min/{1.73_m2} (ref >59)
GFR calc non Af Amer: 62 mL/min/{1.73_m2} (ref >59)
Glucose: 93 mg/dL (ref 65–99)
Potassium: 3.5 mmol/L (ref 3.5–5.2)
Sodium: 139 mmol/L (ref 134–144)

## 2019-09-01 NOTE — Telephone Encounter (Signed)
Patient was advised and states that she will hold off on her Vit D and stop eating a lot of ice cream. Patient will return in 1 month for recheck labs.

## 2019-09-01 NOTE — Telephone Encounter (Signed)
-----   Message from Virginia Crews, MD sent at 09/01/2019  8:18 AM EDT ----- Normal labs,except calcium is slightly high.  Recommend holding any calcium supplements or multivitamins and rechecking in 1 month.

## 2019-09-26 ENCOUNTER — Encounter: Payer: Self-pay | Admitting: Gynecology

## 2020-02-28 ENCOUNTER — Ambulatory Visit: Payer: Self-pay | Admitting: Family Medicine

## 2020-03-18 ENCOUNTER — Encounter: Payer: Self-pay | Admitting: Family Medicine

## 2020-03-18 ENCOUNTER — Other Ambulatory Visit: Payer: Self-pay

## 2020-03-18 ENCOUNTER — Ambulatory Visit: Payer: PRIVATE HEALTH INSURANCE | Admitting: Family Medicine

## 2020-03-18 VITALS — BP 119/84 | HR 82 | Temp 97.3°F | Resp 16 | Ht 64.0 in | Wt 180.0 lb

## 2020-03-18 DIAGNOSIS — E66811 Obesity, class 1: Secondary | ICD-10-CM

## 2020-03-18 DIAGNOSIS — Z23 Encounter for immunization: Secondary | ICD-10-CM

## 2020-03-18 DIAGNOSIS — E782 Mixed hyperlipidemia: Secondary | ICD-10-CM

## 2020-03-18 DIAGNOSIS — I10 Essential (primary) hypertension: Secondary | ICD-10-CM

## 2020-03-18 DIAGNOSIS — G4700 Insomnia, unspecified: Secondary | ICD-10-CM | POA: Diagnosis not present

## 2020-03-18 DIAGNOSIS — Z1231 Encounter for screening mammogram for malignant neoplasm of breast: Secondary | ICD-10-CM

## 2020-03-18 DIAGNOSIS — E669 Obesity, unspecified: Secondary | ICD-10-CM

## 2020-03-18 DIAGNOSIS — Z6830 Body mass index (BMI) 30.0-30.9, adult: Secondary | ICD-10-CM

## 2020-03-18 MED ORDER — PRAVASTATIN SODIUM 80 MG PO TABS: 80.0000 mg | ORAL_TABLET | Freq: Every day | ORAL | 3 refills | Status: DC

## 2020-03-18 MED ORDER — NORTRIPTYLINE HCL 10 MG PO CAPS: 30.0000 mg | ORAL_CAPSULE | Freq: Every day | ORAL | 3 refills | Status: DC

## 2020-03-18 MED ORDER — CHLORTHALIDONE 25 MG PO TABS: 25.0000 mg | ORAL_TABLET | Freq: Every day | ORAL | 3 refills | Status: DC

## 2020-03-18 NOTE — Assessment & Plan Note (Signed)
Previously well controlled Continue statin Recheck FLP and CMP

## 2020-03-18 NOTE — Assessment & Plan Note (Signed)
Discussed importance of healthy weight management Discussed diet and exercise  

## 2020-03-18 NOTE — Patient Instructions (Signed)
   The CDC recommends two doses of Shingrix (the shingles vaccine) separated by 2 to 6 months for adults age 66 years and older. I recommend checking with your insurance plan regarding coverage for this vaccine.   

## 2020-03-18 NOTE — Progress Notes (Signed)
Patient: Katrina Simpson Female    DOB: 26-Feb-1956   64 y.o.   MRN: [id number] Visit Date: 03/18/2020  Today's Provider: Lavon Paganini, MD   Chief Complaint  Patient presents with  . Hypertension   Subjective:     HPI  Hypertension, follow-up:  BP Readings from Last 3 Encounters:  03/18/20 119/84  08/31/19 129/82  04/17/19 126/78    She was last seen for hypertension 6 months ago.  BP at that visit was 129/82. Management changes since that visit include check labs, continue pravastatin and chlorthalidone. She reports excellent compliance with treatment. She is not having side effects.  She is not exercising. She is adherent to low salt diet.   Outside blood pressures are not being checked. She is experiencing none.  Patient denies chest pain and lower extremity edema.   Cardiovascular risk factors include hypertension.  Use of agents associated with hypertension: none.     Weight trend: stable Wt Readings from Last 3 Encounters:  03/18/20 180 lb (81.6 kg)  08/31/19 178 lb 12.8 oz (81.1 kg)  04/17/19 179 lb (81.2 kg)    Current diet: in general, a "healthy" diet    ------------------------------------------------------------------------   Allergies  Allergen Reactions  . Macrobid [Nitrofurantoin Macrocrystal]   . Penicillins Swelling  . Sulfa Antibiotics Itching     Current Outpatient Medications:  .  chlorthalidone (HYGROTON) 25 MG tablet, Take 1 tablet (25 mg total) by mouth daily., Disp: 90 tablet, Rfl: 3 .  ibuprofen (ADVIL,MOTRIN) 200 MG tablet, Take 200-400 mg by mouth every 6 (six) hours as needed for headache or moderate pain., Disp: , Rfl:  .  nortriptyline (PAMELOR) 10 MG capsule, Take 3 capsules (30 mg total) by mouth at bedtime., Disp: 270 capsule, Rfl: 3 .  omeprazole (PRILOSEC OTC) 20 MG tablet, , Disp: , Rfl:  .  pravastatin (PRAVACHOL) 80 MG tablet, Take 1 tablet (80 mg total) by mouth daily., Disp: 90 tablet, Rfl: 3  Review of  Systems  Constitutional: Negative.   Respiratory: Negative.   Cardiovascular: Negative.     Social History   Tobacco Use  . Smoking status: Never Smoker  . Smokeless tobacco: Never Used  Substance Use Topics  . Alcohol use: No      Objective:   BP 119/84 (BP Location: Left Arm, Patient Position: Sitting, Cuff Size: Large)   Pulse 82   Temp (!) 97.3 F (36.3 C) (Temporal)   Resp 16   Ht 5\' 4"  (1.626 m)   Wt 180 lb (81.6 kg)   LMP 12/28/1998   BMI 30.90 kg/m  Vitals:   03/18/20 1539  BP: 119/84  Pulse: 82  Resp: 16  Temp: (!) 97.3 F (36.3 C)  TempSrc: Temporal  Weight: 180 lb (81.6 kg)  Height: 5\' 4"  (1.626 m)  Body mass index is 30.9 kg/m.   Physical Exam Vitals reviewed.  Constitutional:      General: She is not in acute distress.    Appearance: Normal appearance. She is well-developed. She is not diaphoretic.  HENT:     Head: Normocephalic and atraumatic.  Eyes:     General: No scleral icterus.    Conjunctiva/sclera: Conjunctivae normal.  Neck:     Thyroid: No thyromegaly.  Cardiovascular:     Rate and Rhythm: Normal rate and regular rhythm.     Pulses: Normal pulses.     Heart sounds: Normal heart sounds. No murmur.  Pulmonary:     Effort: Pulmonary  effort is normal. No respiratory distress.     Breath sounds: Normal breath sounds. No wheezing, rhonchi or rales.  Musculoskeletal:     Cervical back: Neck supple.     Right lower leg: No edema.     Left lower leg: No edema.  Lymphadenopathy:     Cervical: No cervical adenopathy.  Skin:    General: Skin is warm and dry.     Findings: No rash.  Neurological:     Mental Status: She is alert and oriented to person, place, and time. Mental status is at baseline.  Psychiatric:        Mood and Affect: Mood normal.        Behavior: Behavior normal.    No results found for any visits on 03/18/20.     Assessment & Plan    Problem List Items Addressed This Visit      Cardiovascular and  Mediastinum   Essential hypertension, benign - Primary    Well controlled Continue current medications Recheck metabolic panel F/u in 6 months       Relevant Medications   chlorthalidone (HYGROTON) 25 MG tablet   pravastatin (PRAVACHOL) 80 MG tablet   Other Relevant Orders   Comprehensive metabolic panel     Other   Hyperlipidemia    Previously well controlled Continue statin Recheck FLP and CMP      Relevant Medications   chlorthalidone (HYGROTON) 25 MG tablet   pravastatin (PRAVACHOL) 80 MG tablet   Other Relevant Orders   Comprehensive metabolic panel   Lipid panel   Obesity    Discussed importance of healthy weight management Discussed diet and exercise       Insomnia    Chronic and well controlled Continue Nortriptyline      Relevant Medications   nortriptyline (PAMELOR) 10 MG capsule    Other Visit Diagnoses    Breast cancer screening by mammogram       Relevant Orders   MM 3D SCREEN BREAST BILATERAL   Need for pneumococcal vaccination       Relevant Orders   Pneumococcal conjugate vaccine 13-valent IM   Need for shingles vaccine       Relevant Orders   Varicella-zoster vaccine IM   Need for diphtheria-tetanus-pertussis (Tdap) vaccine       Relevant Orders   Tdap vaccine greater than or equal to 7yo IM       Return in about 6 months (around 09/18/2020) for CPE.   The entirety of the information documented in the History of Present Illness, Review of Systems and Physical Exam were personally obtained by me. Portions of this information were initially documented by Lynford Humphrey, CMA and reviewed by me for thoroughness and accuracy.    Nesta Kimple, Dionne Bucy, MD MPH [facility]

## 2020-03-18 NOTE — Assessment & Plan Note (Signed)
Chronic and well controlled Continue Nortriptyline

## 2020-03-18 NOTE — Assessment & Plan Note (Signed)
Well controlled Continue current medications Recheck metabolic panel F/u in 6 months  

## 2020-04-17 ENCOUNTER — Other Ambulatory Visit: Payer: Self-pay

## 2020-04-17 ENCOUNTER — Ambulatory Visit (INDEPENDENT_AMBULATORY_CARE_PROVIDER_SITE_OTHER): Payer: PRIVATE HEALTH INSURANCE | Admitting: Women's Health

## 2020-04-17 ENCOUNTER — Encounter: Payer: Self-pay | Admitting: Women's Health

## 2020-04-17 VITALS — BP 128/80 | Ht 64.0 in | Wt 176.0 lb

## 2020-04-17 DIAGNOSIS — Z01419 Encounter for gynecological examination (general) (routine) without abnormal findings: Secondary | ICD-10-CM | POA: Diagnosis not present

## 2020-04-17 DIAGNOSIS — Z13820 Encounter for screening for osteoporosis: Secondary | ICD-10-CM | POA: Diagnosis not present

## 2020-04-17 NOTE — Progress Notes (Signed)
   MERIS BORLAND July 03, 1956 [id number]   History:  64 y.o.  for annual exam.  Postmenopausal on no HRT with no bleeding.  1996 LGSIL with normal Paps after.  Normal mammogram history.  2018 T score -2.1 FRAX 4.6% / 0.6%.  Primary care manages hypertension and hypercholesteremia.  2019 - colonoscopy.  Vaccines are current.  Past medical history, past surgical history, family history and social history were all reviewed and documented in the EPIC chart.  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  4 sons twins are 69 has 5 grandchildren.  ROS:  A ROS was performed and pertinent positives and negatives are included.  Exam:  Vitals:   04/17/20 1123  BP: 128/80  Weight: 176 lb (79.8 kg)  Height: 5\' 4"  (1.626 m)   Body mass index is 30.21 kg/m.  General appearance:  Normal Thyroid:  Symmetrical, normal in size, without palpable masses or nodularity. Respiratory  Auscultation:  Clear without wheezing or rhonchi Cardiovascular  Auscultation:  Regular rate, without rubs, murmurs or gallops  Edema/varicosities:  Not grossly evident Abdominal  Soft,nontender, without masses, guarding or rebound.  Liver/spleen:  No organomegaly noted  Hernia:  None appreciated  Skin  Inspection:  Grossly normal, numerous moles and skin tags   Breasts: Examined lying and sitting.   Right: Without masses, retractions, discharge or axillary adenopathy.   Left: Without masses, retractions, discharge or axillary adenopathy. Gentitourinary   Inguinal/mons:  Normal without inguinal adenopathy  External genitalia: Numerous sebaceous cysts on both labia majora's   BUS/Urethra/Skene's glands:  Normal  Vagina:  Normal  Cervix:  Normal  Uterus:   normal in size, shape and contour.  Midline and mobile  Adnexa/parametria:     Rt: Without masses or tenderness.   Lt: Without masses or tenderness.  Anus and perineum: Normal  Digital rectal exam: Normal sphincter tone without palpated masses or  tenderness  Assessment/Plan:  64 y.o. MWF G3, P4 for annual exam with no complaints of vaginal discharge, urinary symptoms, or abdominal pain.  Postmenopausal on no HRT with no bleeding Hypertension, hypercholesteremia primary care manages labs and meds Osteopenia without elevated FRAX  2019 - colonoscopy Numerous moles-annual dermatology appointment  Plan: Schedule DEXA.  Reviewed importance of weightbearing and balance type exercise, home safety and fall prevention discussed.  SBEs, overdue for annual mammogram breast center phone number given instructed to schedule.  Vitamin D 2000 IUs daily encouraged.  Pap with HR HPV typing, reviewed if normal will not need any further Pap testing.     Assumption, 11:30 AM 04/17/2020

## 2020-04-17 NOTE — Patient Instructions (Addendum)
Vitamin D 2000 IUs daily Is been a pleasure knowing you! [facility] center [id number] Bone density Health Maintenance After Age 66 After age 51, you are at a higher risk for certain long-term diseases and infections as well as injuries from falls. Falls are a major cause of broken bones and head injuries in people who are older than age 25. Getting regular preventive care can help to keep you healthy and well. Preventive care includes getting regular testing and making lifestyle changes as recommended by your health care provider. Talk with your health care provider about:  Which screenings and tests you should have. A screening is a test that checks for a disease when you have no symptoms.  A diet and exercise plan that is right for you. What should I know about screenings and tests to prevent falls? Screening and testing are the best ways to find a health problem early. Early diagnosis and treatment give you the best chance of managing medical conditions that are common after age 46. Certain conditions and lifestyle choices may make you more likely to have a fall. Your health care provider may recommend:  Regular vision checks. Poor vision and conditions such as cataracts can make you more likely to have a fall. If you wear glasses, make sure to get your prescription updated if your vision changes.  Medicine review. Work with your health care provider to regularly review all of the medicines you are taking, including over-the-counter medicines. Ask your health care provider about any side effects that may make you more likely to have a fall. Tell your health care provider if any medicines that you take make you feel dizzy or sleepy.  Osteoporosis screening. Osteoporosis is a condition that causes the bones to get weaker. This can make the bones weak and cause them to break more easily.  Blood pressure screening. Blood pressure changes and medicines to control blood pressure can make you feel  dizzy.  Strength and balance checks. Your health care provider may recommend certain tests to check your strength and balance while standing, walking, or changing positions.  Foot health exam. Foot pain and numbness, as well as not wearing proper footwear, can make you more likely to have a fall.  Depression screening. You may be more likely to have a fall if you have a fear of falling, feel emotionally low, or feel unable to do activities that you used to do.  Alcohol use screening. Using too much alcohol can affect your balance and may make you more likely to have a fall. What actions can I take to lower my risk of falls? General instructions  Talk with your health care provider about your risks for falling. Tell your health care provider if: ? You fall. Be sure to tell your health care provider about all falls, even ones that seem minor. ? You feel dizzy, sleepy, or off-balance.  Take over-the-counter and prescription medicines only as told by your health care provider. These include any supplements.  Eat a healthy diet and maintain a healthy weight. A healthy diet includes low-fat dairy products, low-fat (lean) meats, and fiber from whole grains, beans, and lots of fruits and vegetables. Home safety  Remove any tripping hazards, such as rugs, cords, and clutter.  Install safety equipment such as grab bars in bathrooms and safety rails on stairs.  Keep rooms and walkways well-lit. Activity   Follow a regular exercise program to stay fit. This will help you maintain your balance. Ask your health care  provider what types of exercise are appropriate for you.  If you need a cane or walker, use it as recommended by your health care provider.  Wear supportive shoes that have nonskid soles. Lifestyle  Do not drink alcohol if your health care provider tells you not to drink.  If you drink alcohol, limit how much you have: ? 0-1 drink a day for women. ? 0-2 drinks a day for  men.  Be aware of how much alcohol is in your drink. In the [place] one drink equals one typical bottle of beer (12 oz), one-half glass of wine (5 oz), or one shot of hard liquor (1 oz).  Do not use any products that contain nicotine or tobacco, such as cigarettes and e-cigarettes. If you need help quitting, ask your health care provider. Summary  Having a healthy lifestyle and getting preventive care can help to protect your health and wellness after age 79.  Screening and testing are the best way to find a health problem early and help you avoid having a fall. Early diagnosis and treatment give you the best chance for managing medical conditions that are more common for people who are older than age 37.  Falls are a major cause of broken bones and head injuries in people who are older than age 33. Take precautions to prevent a fall at home.  Work with your health care provider to learn what changes you can make to improve your health and wellness and to prevent falls. This information is not intended to replace advice given to you by your health care provider. Make sure you discuss any questions you have with your health care provider. Document Revised: 04/06/2019 Document Reviewed: 10/27/2017 Elsevier Patient Education  2020 [employer].

## 2020-04-17 NOTE — Addendum Note (Signed)
Addended by: Lorine Bears on: 04/17/2020 12:13 PM   Modules accepted: Orders

## 2020-04-19 LAB — URINALYSIS, COMPLETE W/RFL CULTURE
Bacteria, UA: NONE SEEN /HPF
Bilirubin Urine: NEGATIVE
Glucose, UA: NEGATIVE
Hgb urine dipstick: NEGATIVE
Hyaline Cast: NONE SEEN /LPF
Ketones, ur: NEGATIVE
Nitrites, Initial: NEGATIVE
Protein, ur: NEGATIVE
Specific Gravity, Urine: 1.01 (ref 1.001–1.03)
pH: 6.5 (ref 5.0–8.0)

## 2020-04-19 LAB — URINE CULTURE
MICRO NUMBER:: 10391332
Result:: NO GROWTH
SPECIMEN QUALITY:: ADEQUATE

## 2020-04-19 LAB — PAP, TP IMAGING W/ HPV RNA, RFLX HPV TYPE 16,18/45: HPV DNA High Risk: NOT DETECTED

## 2020-04-19 LAB — CULTURE INDICATED

## 2020-09-19 ENCOUNTER — Encounter: Payer: Self-pay | Admitting: Family Medicine

## 2020-09-23 ENCOUNTER — Encounter: Payer: Self-pay | Admitting: Family Medicine

## 2020-09-23 ENCOUNTER — Other Ambulatory Visit: Payer: Self-pay

## 2020-09-23 ENCOUNTER — Ambulatory Visit (INDEPENDENT_AMBULATORY_CARE_PROVIDER_SITE_OTHER): Payer: PRIVATE HEALTH INSURANCE | Admitting: Family Medicine

## 2020-09-23 VITALS — BP 110/73 | HR 71 | Temp 98.2°F | Ht 65.0 in | Wt 173.0 lb

## 2020-09-23 DIAGNOSIS — I10 Essential (primary) hypertension: Secondary | ICD-10-CM

## 2020-09-23 DIAGNOSIS — E782 Mixed hyperlipidemia: Secondary | ICD-10-CM | POA: Diagnosis not present

## 2020-09-23 DIAGNOSIS — Z114 Encounter for screening for human immunodeficiency virus [HIV]: Secondary | ICD-10-CM | POA: Diagnosis not present

## 2020-09-23 DIAGNOSIS — Z0000 Encounter for general adult medical examination without abnormal findings: Secondary | ICD-10-CM | POA: Diagnosis not present

## 2020-09-23 DIAGNOSIS — Z23 Encounter for immunization: Secondary | ICD-10-CM

## 2020-09-23 DIAGNOSIS — E663 Overweight: Secondary | ICD-10-CM

## 2020-09-23 NOTE — Assessment & Plan Note (Signed)
Previously well controlled Continue statin Repeat FLP and CMP Goal LDL < 100

## 2020-09-23 NOTE — Assessment & Plan Note (Signed)
Well controlled Continue current medications Recheck metabolic panel F/u in 6 months  

## 2020-09-23 NOTE — Progress Notes (Signed)
Complete physical exam   Patient: Katrina Simpson   DOB: 11-Oct-1956   64 y.o. Female  MRN: [id number] Visit Date: 09/23/2020  Today's healthcare provider: Lavon Paganini, MD   Chief Complaint  Patient presents with  . Annual Exam   Subjective    Katrina Simpson is a 64 y.o. female who presents today for a complete physical exam.  She reports consuming a general diet. The patient does not participate in regular exercise at present. She generally feels well. She reports sleeping well. She does not have additional problems to discuss today.   HPI    Past Medical History:  Diagnosis Date  . Hyperlipidemia   . Hypertension   . Insomnia   . Osteopenia 04/2017   T score -2.1 FRAX 4.6%/0.6%   Past Surgical History:  Procedure Laterality Date  . CESAREAN SECTION    . COLONOSCOPY N/A 05/03/2015   Procedure: COLONOSCOPY;  Surgeon: Manya Silvas, MD;  Location: [facility] ENDOSCOPY;  Service: Endoscopy;  Laterality: N/A;  . TUBAL LIGATION     Social History   Socioeconomic History  . Marital status: Married    Spouse name: Not on file  . Number of children: Not on file  . Years of education: Not on file  . Highest education level: Not on file  Occupational History  . Not on file  Tobacco Use  . Smoking status: Never Smoker  . Smokeless tobacco: Never Used  Vaping Use  . Vaping Use: Never used  Substance and Sexual Activity  . Alcohol use: No  . Drug use: No  . Sexual activity: Yes    Birth control/protection: Post-menopausal  Other Topics Concern  . Not on file  Social History Narrative  . Not on file   Social Determinants of Health   Financial Resource Strain:   . Difficulty of Paying Living Expenses: Not on file  Food Insecurity:   . Worried About Charity fundraiser in the Last Year: Not on file  . Ran Out of Food in the Last Year: Not on file  Transportation Needs:   . Lack of Transportation (Medical): Not on file  . Lack of Transportation (Non-Medical):  Not on file  Physical Activity:   . Days of Exercise per Week: Not on file  . Minutes of Exercise per Session: Not on file  Stress:   . Feeling of Stress : Not on file  Social Connections:   . Frequency of Communication with Friends and Family: Not on file  . Frequency of Social Gatherings with Friends and Family: Not on file  . Attends Religious Services: Not on file  . Active Member of Clubs or Organizations: Not on file  . Attends Archivist Meetings: Not on file  . Marital Status: Not on file  Intimate Partner Violence:   . Fear of Current or Ex-Partner: Not on file  . Emotionally Abused: Not on file  . Physically Abused: Not on file  . Sexually Abused: Not on file   Family Status  Relation Name Status  . Father  Deceased  . Mother  (Not Specified)  . Sister  Alive  . Brother  Alive  . Neg Hx  (Not Specified)   Family History  Problem Relation Age of Onset  . Stroke Father 64  . Hypertension Mother 3  . Healthy Brother   . Colon cancer Neg Hx   . Breast cancer Neg Hx    Allergies  Allergen Reactions  .  Macrobid [Nitrofurantoin Macrocrystal]   . Penicillins Swelling  . Sulfa Antibiotics Itching    Patient Care Team: Virginia Crews, MD as PCP - General (Family Medicine)   Medications: Outpatient Medications Prior to Visit  Medication Sig  . chlorthalidone (HYGROTON) 25 MG tablet Take 1 tablet (25 mg total) by mouth daily.  Marland Kitchen ibuprofen (ADVIL,MOTRIN) 200 MG tablet Take 200-400 mg by mouth every 6 (six) hours as needed for headache or moderate pain.  . nortriptyline (PAMELOR) 10 MG capsule Take 3 capsules (30 mg total) by mouth at bedtime.  Marland Kitchen omeprazole (PRILOSEC OTC) 20 MG tablet   . pravastatin (PRAVACHOL) 80 MG tablet Take 1 tablet (80 mg total) by mouth daily.   No facility-administered medications prior to visit.    Review of Systems  Constitutional: Negative.   HENT: Negative.   Eyes: Negative.   Respiratory: Negative.     Cardiovascular: Negative.   Gastrointestinal: Negative.   Endocrine: Negative.   Genitourinary: Negative.   Musculoskeletal: Negative.   Skin: Negative.   Allergic/Immunologic: Negative.   Neurological: Negative.   Hematological: Negative.   Psychiatric/Behavioral: Negative.       Objective    BP 110/73 (BP Location: Right Arm, Patient Position: Sitting, Cuff Size: Large)   Pulse 71   Temp 98.2 F (36.8 C) (Oral)   Ht 5\' 5"  (1.651 m)   Wt 173 lb (78.5 kg)   LMP 12/28/1998   BMI 28.79 kg/m    Physical Exam Vitals reviewed.  Constitutional:      General: She is not in acute distress.    Appearance: Normal appearance. She is well-developed. She is not diaphoretic.  HENT:     Head: Normocephalic and atraumatic.     Right Ear: Tympanic membrane, ear canal and external ear normal.     Left Ear: Tympanic membrane, ear canal and external ear normal.  Eyes:     General: No scleral icterus.    Conjunctiva/sclera: Conjunctivae normal.     Pupils: Pupils are equal, round, and reactive to light.  Neck:     Thyroid: No thyromegaly.  Cardiovascular:     Rate and Rhythm: Normal rate and regular rhythm.     Pulses: Normal pulses.     Heart sounds: Normal heart sounds. No murmur heard.   Pulmonary:     Effort: Pulmonary effort is normal. No respiratory distress.     Breath sounds: Normal breath sounds. No wheezing or rales.  Abdominal:     General: There is no distension.     Palpations: Abdomen is soft.     Tenderness: There is no abdominal tenderness.  Musculoskeletal:        General: No deformity.     Cervical back: Neck supple.     Right lower leg: No edema.     Left lower leg: No edema.  Lymphadenopathy:     Cervical: No cervical adenopathy.  Skin:    General: Skin is warm and dry.     Findings: No rash.  Neurological:     Mental Status: She is alert and oriented to person, place, and time. Mental status is at baseline.     Sensory: No sensory deficit.      Motor: No weakness.     Gait: Gait normal.  Psychiatric:        Mood and Affect: Mood normal.        Behavior: Behavior normal.        Thought Content: Thought content normal.  Last depression screening scores PHQ 2/9 Scores 03/18/2020 12/29/2018  PHQ - 2 Score 0 0   Last fall risk screening Fall Risk  03/18/2020  Falls in the past year? 0  Number falls in past yr: 0  Injury with Fall? 0  Risk for fall due to : No Fall Risks  Follow up Falls evaluation completed   Last Audit-C alcohol use screening No flowsheet data found. A score of 3 or more in women, and 4 or more in men indicates increased risk for alcohol abuse, EXCEPT if all of the points are from question 1   No results found for any visits on 09/23/20.  Assessment & Plan    Routine Health Maintenance and Physical Exam  Exercise Activities and Dietary recommendations Goals   None     Immunization History  Administered Date(s) Administered  . Fluad Quad(high Dose 65+) 09/23/2020  . Influenza,inj,Quad PF,6+ Mos 10/17/2016, 10/02/2017, 10/08/2018, 08/31/2019  . Influenza-Unspecified 10/20/2013, 09/28/2015  . PFIZER SARS-COV-2 Vaccination 01/12/2020, 02/02/2020  . Pneumococcal Conjugate-13 03/18/2020  . Tdap 03/18/2020  . Zoster Recombinat (Shingrix) 03/18/2020    Health Maintenance  Topic Date Due  . HIV Screening  Never done  . MAMMOGRAM  03/11/2020  . PNA vac Low Risk Adult (2 of 2 - PPSV23) 03/18/2021  . PAP SMEAR-Modifier  04/18/2023  . COLONOSCOPY  05/02/2025  . TETANUS/TDAP  03/18/2030  . INFLUENZA VACCINE  Completed  . DEXA SCAN  Completed  . COVID-19 Vaccine  Completed  . Hepatitis C Screening  Completed    Discussed health benefits of physical activity, and encouraged her to engage in regular exercise appropriate for her age and condition.  Upcoming mammogram and DEXA  Will be scheduled for [place] shot and 2nd Shingrix   Problem List Items Addressed This Visit       Cardiovascular and Mediastinum   Essential hypertension, benign    Well controlled Continue current medications Recheck metabolic panel F/u in 6 months       Relevant Orders   Comprehensive metabolic panel     Other   Hyperlipidemia    Previously well controlled Continue statin Repeat FLP and CMP Goal LDL < 100      Relevant Orders   Comprehensive metabolic panel   Lipid panel   Overweight    Discussed importance of healthy weight management Discussed diet and exercise       Relevant Orders   Comprehensive metabolic panel   Lipid panel    Other Visit Diagnoses    Encounter for annual physical exam    -  Primary   Relevant Orders   Comprehensive metabolic panel   Lipid panel   HIV antibody (with reflex)   Need for influenza vaccination       Relevant Orders   Flu Vaccine QUAD High Dose(Fluad) (Completed)   Encounter for screening for HIV       Relevant Orders   HIV antibody (with reflex)       Return in about 6 months (around 03/23/2021) for chronic disease f/u.     I, Lavon Paganini, MD, have reviewed all documentation for this visit. The documentation on 09/23/20 for the exam, diagnosis, procedures, and orders are all accurate and complete.   Allayah Raineri, Dionne Bucy, MD, MPH [facility]

## 2020-09-23 NOTE — Patient Instructions (Signed)
We will schedule you for your 3rd COVID vacine and your 2nd Shingles vaccine before you go today   Preventive Care 66 Years and Older, Female Preventive care refers to lifestyle choices and visits with your health care provider that can promote health and wellness. This includes:  A yearly physical exam. This is also called an annual well check.  Regular dental and eye exams.  Immunizations.  Screening for certain conditions.  Healthy lifestyle choices, such as diet and exercise. What can I expect for my preventive care visit? Physical exam Your health care provider will check:  Height and weight. These may be used to calculate body mass index (BMI), which is a measurement that tells if you are at a healthy weight.  Heart rate and blood pressure.  Your skin for abnormal spots. Counseling Your health care provider may ask you questions about:  Alcohol, tobacco, and drug use.  Emotional well-being.  Home and relationship well-being.  Sexual activity.  Eating habits.  History of falls.  Memory and ability to understand (cognition).  Work and work Statistician.  Pregnancy and menstrual history. What immunizations do I need?  Influenza (flu) vaccine  This is recommended every year. Tetanus, diphtheria, and pertussis (Tdap) vaccine  You may need a Td booster every 10 years. Varicella (chickenpox) vaccine  You may need this vaccine if you have not already been vaccinated. Zoster (shingles) vaccine  You may need this after age 53. Pneumococcal conjugate (PCV13) vaccine  One dose is recommended after age 56. Pneumococcal polysaccharide (PPSV23) vaccine  One dose is recommended after age 9. Measles, mumps, and rubella (MMR) vaccine  You may need at least one dose of MMR if you were born in 1957 or later. You may also need a second dose. Meningococcal conjugate (MenACWY) vaccine  You may need this if you have certain conditions. Hepatitis A vaccine  You  may need this if you have certain conditions or if you travel or work in places where you may be exposed to hepatitis A. Hepatitis B vaccine  You may need this if you have certain conditions or if you travel or work in places where you may be exposed to hepatitis B. Haemophilus influenzae type b (Hib) vaccine  You may need this if you have certain conditions. You may receive vaccines as individual doses or as more than one vaccine together in one shot (combination vaccines). Talk with your health care provider about the risks and benefits of combination vaccines. What tests do I need? Blood tests  Lipid and cholesterol levels. These may be checked every 5 years, or more frequently depending on your overall health.  Hepatitis C test.  Hepatitis B test. Screening  Lung cancer screening. You may have this screening every year starting at age 97 if you have a 30-pack-year history of smoking and currently smoke or have quit within the past 15 years.  Colorectal cancer screening. All adults should have this screening starting at age 60 and continuing until age 62. Your health care provider may recommend screening at age 73 if you are at increased risk. You will have tests every 1-10 years, depending on your results and the type of screening test.  Diabetes screening. This is done by checking your blood sugar (glucose) after you have not eaten for a while (fasting). You may have this done every 1-3 years.  Mammogram. This may be done every 1-2 years. Talk with your health care provider about how often you should have regular mammograms.  BRCA-related cancer screening. This may be done if you have a family history of breast, ovarian, tubal, or peritoneal cancers. Other tests  Sexually transmitted disease (STD) testing.  Bone density scan. This is done to screen for osteoporosis. You may have this done starting at age 69. Follow these instructions at home: Eating and drinking  Eat a diet  that includes fresh fruits and vegetables, whole grains, lean protein, and low-fat dairy products. Limit your intake of foods with high amounts of sugar, saturated fats, and salt.  Take vitamin and mineral supplements as recommended by your health care provider.  Do not drink alcohol if your health care provider tells you not to drink.  If you drink alcohol: ? Limit how much you have to 0-1 drink a day. ? Be aware of how much alcohol is in your drink. In the [place] one drink equals one 12 oz bottle of beer (355 mL), one 5 oz glass of wine (148 mL), or one 1 oz glass of hard liquor (44 mL). Lifestyle  Take daily care of your teeth and gums.  Stay active. Exercise for at least 30 minutes on 5 or more days each week.  Do not use any products that contain nicotine or tobacco, such as cigarettes, e-cigarettes, and chewing tobacco. If you need help quitting, ask your health care provider.  If you are sexually active, practice safe sex. Use a condom or other form of protection in order to prevent STIs (sexually transmitted infections).  Talk with your health care provider about taking a low-dose aspirin or statin. What's next?  Go to your health care provider once a year for a well check visit.  Ask your health care provider how often you should have your eyes and teeth checked.  Stay up to date on all vaccines. This information is not intended to replace advice given to you by your health care provider. Make sure you discuss any questions you have with your health care provider. Document Revised: 12/08/2018 Document Reviewed: 12/08/2018 Elsevier Patient Education  2020 [employer].

## 2020-09-23 NOTE — Assessment & Plan Note (Signed)
Discussed importance of healthy weight management Discussed diet and exercise  

## 2020-10-24 ENCOUNTER — Ambulatory Visit: Payer: PRIVATE HEALTH INSURANCE

## 2020-11-19 ENCOUNTER — Ambulatory Visit (INDEPENDENT_AMBULATORY_CARE_PROVIDER_SITE_OTHER): Payer: PRIVATE HEALTH INSURANCE

## 2020-11-19 ENCOUNTER — Other Ambulatory Visit: Payer: Self-pay

## 2020-11-19 ENCOUNTER — Other Ambulatory Visit: Payer: Self-pay | Admitting: Women's Health

## 2020-11-19 DIAGNOSIS — Z780 Asymptomatic menopausal state: Secondary | ICD-10-CM | POA: Diagnosis not present

## 2020-11-19 DIAGNOSIS — M8589 Other specified disorders of bone density and structure, multiple sites: Secondary | ICD-10-CM

## 2020-11-19 DIAGNOSIS — Z13820 Encounter for screening for osteoporosis: Secondary | ICD-10-CM

## 2020-11-28 ENCOUNTER — Ambulatory Visit (INDEPENDENT_AMBULATORY_CARE_PROVIDER_SITE_OTHER): Payer: PRIVATE HEALTH INSURANCE | Admitting: Family Medicine

## 2020-11-28 ENCOUNTER — Other Ambulatory Visit: Payer: Self-pay

## 2020-11-28 ENCOUNTER — Ambulatory Visit: Payer: PRIVATE HEALTH INSURANCE | Admitting: Family Medicine

## 2020-11-28 DIAGNOSIS — Z23 Encounter for immunization: Secondary | ICD-10-CM

## 2020-11-28 NOTE — Progress Notes (Signed)
Nurse visit for Shingrix vaccine.

## 2021-01-21 ENCOUNTER — Encounter: Payer: Self-pay | Admitting: Family Medicine

## 2021-01-21 DIAGNOSIS — G4700 Insomnia, unspecified: Secondary | ICD-10-CM

## 2021-01-21 MED ORDER — NORTRIPTYLINE HCL 50 MG PO CAPS: 50.0000 mg | ORAL_CAPSULE | Freq: Every day | ORAL | 1 refills | Status: DC

## 2021-02-10 ENCOUNTER — Encounter: Payer: Self-pay | Admitting: Family Medicine

## 2021-03-07 ENCOUNTER — Other Ambulatory Visit: Payer: Self-pay | Admitting: Sports Medicine

## 2021-03-07 DIAGNOSIS — M25561 Pain in right knee: Secondary | ICD-10-CM

## 2021-03-07 DIAGNOSIS — M25461 Effusion, right knee: Secondary | ICD-10-CM

## 2021-03-09 ENCOUNTER — Ambulatory Visit
Admission: RE | Admit: 2021-03-09 | Discharge: 2021-03-09 | Disposition: A | Discharge status: Home / Self Care | Payer: PRIVATE HEALTH INSURANCE | Source: Ambulatory Visit | Attending: Sports Medicine | Admitting: Sports Medicine

## 2021-03-09 ENCOUNTER — Other Ambulatory Visit: Payer: Self-pay

## 2021-03-09 DIAGNOSIS — M25461 Effusion, right knee: Secondary | ICD-10-CM | POA: Diagnosis not present

## 2021-03-09 DIAGNOSIS — M25561 Pain in right knee: Secondary | ICD-10-CM | POA: Insufficient documentation

## 2021-03-17 ENCOUNTER — Ambulatory Visit: Payer: Self-pay | Admitting: *Deleted

## 2021-03-17 ENCOUNTER — Encounter: Payer: Self-pay | Admitting: Family Medicine

## 2021-03-17 NOTE — Telephone Encounter (Signed)
Noted  

## 2021-03-17 NOTE — Telephone Encounter (Signed)
Third attempted to reach patient- left message on VM to call office.

## 2021-03-17 NOTE — Telephone Encounter (Signed)
Patient returned call concerning new rash and severe itching since Saturday. Patient reports she took tylenol ES on Friday per instructions of orthopedics suggestion. Patient reports she has taken "several " benadryl and it has not been effective for management of rash. Denies difficulty breathing or swallowing or chest pain at this time. Patient reports she can't hardly stand it. Called [facility] and no available appt today. appt scheduled for 03/18/21. Patient requesting any appt today . Care advise given. Patient verbalized understanding to call back or go to [facility] or ED if symptoms worsen. Please advise .    Reason for Disposition . SEVERE itching (i.e., interferes with sleep, normal activities or school)  Answer Assessment - Initial Assessment Questions 1.) CALLER DIAGNOSIS: "What do you think is causing the rash?" (e.g., Chickenpox, Hives, Impetigo, Athlete's Foot, etc.)  2.) LOCATION:  "Is it widespread or localized?"  3.) NEW MEDICATIONS: "Are you taking any new medicine?" Patient c/o rash and severe itching on body since Saturday. Patient started taking tylenol extra strength per orthopedic suggestion.  Answer Assessment - Initial Assessment Questions 1. APPEARANCE of RASH: "Describe the rash." (e.g., spots, blisters, raised areas, skin peeling, scaly)     Rash  2. SIZE: "How big are the spots?" (e.g., tip of pen, eraser, coin; inches, centimeters)     na 3. LOCATION: "Where is the rash located?"     All over  4. COLOR: "What color is the rash?" (Note: It is difficult to assess rash color in people with darker-colored skin. When this situation occurs, simply ask the caller to describe what they see.)     na 5. ONSET: "When did the rash begin?"     Saturday  6. FEVER: "Do you have a fever?" If Yes, ask: "What is your temperature, how was it measured, and when did it start?"     n 7. ITCHING: "Does the rash itch?" If Yes, ask: "How bad is the itch?" (Scale 1-10; or mild, moderate, severe)      YES SEVERE 8. CAUSE: "What do you think is causing the rash?"     Not sure maybe tylenol ES. 9. MEDICATION FACTORS: "Have you started any new medications within the last 2 weeks?" (e.g., antibiotics)      Tylenol extra strength 10. OTHER SYMPTOMS: "Do you have any other symptoms?" (e.g., dizziness, headache, sore throat, joint pain)       na 11. PREGNANCY: "Is there any chance you are pregnant?" "When was your last menstrual period?"       na  Protocols used: RASH OR REDNESS - [place], [place]

## 2021-03-17 NOTE — Telephone Encounter (Signed)
Summary: allergic reaction   Pt is having allergic reaction to something and she has a rash allover and itching. Please advise      Second attempt to reach patient- left message to call office

## 2021-03-18 ENCOUNTER — Other Ambulatory Visit: Payer: Self-pay

## 2021-03-18 ENCOUNTER — Ambulatory Visit: Payer: PRIVATE HEALTH INSURANCE | Admitting: Family Medicine

## 2021-03-18 ENCOUNTER — Encounter: Payer: Self-pay | Admitting: Family Medicine

## 2021-03-18 VITALS — BP 132/84 | HR 96 | Temp 98.9°F | Wt 174.2 lb

## 2021-03-18 DIAGNOSIS — E663 Overweight: Secondary | ICD-10-CM | POA: Diagnosis not present

## 2021-03-18 DIAGNOSIS — I10 Essential (primary) hypertension: Secondary | ICD-10-CM

## 2021-03-18 DIAGNOSIS — L270 Generalized skin eruption due to drugs and medicaments taken internally: Secondary | ICD-10-CM | POA: Diagnosis not present

## 2021-03-18 DIAGNOSIS — E782 Mixed hyperlipidemia: Secondary | ICD-10-CM

## 2021-03-18 MED ORDER — TRIAMCINOLONE ACETONIDE 0.5 % EX OINT: 1.0000 "application " | TOPICAL_OINTMENT | Freq: Two times a day (BID) | CUTANEOUS | 0 refills | Status: DC

## 2021-03-18 MED ORDER — LIDOCAINE HCL (PF) 1 % IJ SOLN
2.0000 mL | Freq: Once | INTRAMUSCULAR | Status: AC
  Administered 2021-03-18: 2 mL via INTRADERMAL

## 2021-03-18 MED ORDER — METHYLPREDNISOLONE ACETATE 40 MG/ML IJ SUSP
40.0000 mg | Freq: Once | INTRAMUSCULAR | Status: AC
Start: 2021-03-18 — End: 2021-03-18
  Administered 2021-03-18: 40 mg via INTRAMUSCULAR

## 2021-03-18 MED ORDER — METHYLPREDNISOLONE ACETATE 40 MG/ML IJ SUSP
40.0000 mg | Freq: Once | INTRAMUSCULAR | Status: AC
  Administered 2021-03-18: 40 mg via INTRAMUSCULAR

## 2021-03-18 MED ORDER — HYDROXYZINE HCL 10 MG PO TABS: 10.0000 mg | ORAL_TABLET | Freq: Three times a day (TID) | ORAL | 0 refills | Status: DC | PRN

## 2021-03-18 MED ORDER — LIDOCAINE HCL (PF) 1 % IJ SOLN
2.0000 mL | Freq: Once | INTRAMUSCULAR | Status: AC
Start: 2021-03-18 — End: 2021-03-18
  Administered 2021-03-18: 2 mL via INTRADERMAL

## 2021-03-18 NOTE — Assessment & Plan Note (Signed)
Previously well controlled Continue statin Repeat FLP and CMP today Goal LDL less than 100

## 2021-03-18 NOTE — Patient Instructions (Signed)
Drug Rash A drug rash occurs when a medicine causes a change in the color or texture of the skin. It can develop minutes, hours, or days after you take the medicine. The rash may appear on a small area of skin or all over your body. What are the causes? This condition is usually caused by your body's reaction (allergy) to a medicine. It can also be caused by exposure to sunlight after taking a medicine that makes your skin sensitive to light. Though any medicine can cause a rash or reaction, medicines that are more likely to cause rashes include:  Penicillin.  Antibiotic medicines.  Medicines that treat seizures.  Medicines that treat cancer (chemotherapy).  Aspirin and other NSAIDs.  Injectable dyes that contain iodine.  Insulin. What are the signs or symptoms? Symptoms of this condition include:  Redness.  Tiny bumps.  Peeling.  Itching.  Itchy welts (hives).  Swelling.   How is this diagnosed? This condition may be diagnosed based on:  A physical exam.  Tests to find out which medicine caused the rash. These tests may include: ? Skin tests. ? Blood tests. ? Challenge test. For this test, you stop taking all the medicines that you do not need to take. Then, you start taking them again by adding back one medicine at a time. How is this treated? This condition is treated with medicines, including:  Antihistamine. This may be given to relieve itching.  NSAIDs. These may be given to reduce swelling and to treat pain.  A steroid medicine. This may be given to reduce swelling. The rash usually goes away when you stop taking the medicine that caused it. Follow these instructions at home:  Take over-the-counter and prescription medicines only as told by your health care provider.  Tell all your health care providers about any medicine reactions that you have had in the past.  If your rash was caused by sensitivity to sunlight, and while your rash is  healing: ? Avoid being in the sun if possible, especially when it is strongest, usually between 10 a.m. and 4 p.m. ? Cover your skin with pants, long sleeves, and a hat when you are exposed to sunlight.  If you have hives: ? Take a cool shower or use a cool compress to relieve itchiness. ? Take over-the-counter antihistamines, as recommended by your health care provider, until the hives are gone. Hives are not contagious.  Keep all follow-up visits as told by your health care provider. This is important. Contact a health care provider if you have:  A fever.  A rash that is not going away.  A rash that gets worse.  A rash that comes back.  Wheezing or coughing. Get help right away if:  You start to have breathing problems.  You start to have shortness of breath.  Your face or throat starts to swell.  You have severe weakness with dizziness or fainting.  You have chest pain. These symptoms may represent a serious problem that is an emergency. Do not wait to see if the symptoms will go away. Get medical help right away. Call your local emergency services (911 in the [place] Do not drive yourself to the hospital. Summary  A drug rash occurs when a medicine causes a change in the color or texture of the skin. The rash may appear on a small area of skin or all over your body.  It can develop minutes, hours, or days after you take the medicine.  Your health   care provider will do various tests to determine what medicine caused your rash.  The rash may be treated with medicine to relieve itching, swelling, and pain. This information is not intended to replace advice given to you by your health care provider. Make sure you discuss any questions you have with your health care provider. Document Revised: 03/27/2020 Document Reviewed: 03/27/2020 Elsevier Patient Education  2021 Elsevier Inc.  

## 2021-03-18 NOTE — Assessment & Plan Note (Signed)
Discussed importance of healthy weight management Discussed diet and exercise  

## 2021-03-18 NOTE — Progress Notes (Signed)
Established patient visit   Patient: Katrina Simpson   DOB: 1956/08/11   65 y.o. Female  MRN: [id number] Visit Date: 03/18/2021  Today's healthcare provider: Lavon Paganini, MD   Chief Complaint  Patient presents with  . Rash  I,Antia Rahal,acting as a Education administrator for Lavon Paganini, MD.,have documented all relevant documentation on the behalf of Lavon Paganini, MD,as directed by  Lavon Paganini, MD while in the presence of Lavon Paganini, MD.  Subjective    Rash This is a new problem. The current episode started in the past 7 days. The problem has been gradually worsening since onset. The affected locations include the left upper leg, left arm, left elbow, abdomen, face, neck, back, left wrist, right arm, right shoulder, left shoulder, right elbow, right wrist and right upper leg. The rash is characterized by dryness, redness and itchiness. She was exposed to a new medication (tylenol). Pertinent negatives include no congestion, cough, diarrhea, fatigue, fever, joint pain, shortness of breath or sore throat. Treatments tried: benadryl. The treatment provided mild relief.    She is not taking diclofenac for the last 3 weeks or so.  She just started taking Tylenol the night before her rash started.  She had also eaten vegetables with a new seasoning on them at a restaurant right before this occurred.  She has had no shortness of breath or chest tightening.  Social History   Tobacco Use  . Smoking status: Never Smoker  . Smokeless tobacco: Never Used  Vaping Use  . Vaping Use: Never used  Substance Use Topics  . Alcohol use: No  . Drug use: No       Medications: Outpatient Medications Prior to Visit  Medication Sig  . chlorthalidone (HYGROTON) 25 MG tablet Take 1 tablet (25 mg total) by mouth daily.  . diclofenac (VOLTAREN) 75 MG EC tablet Take 75 mg by mouth 2 (two) times daily.  Marland Kitchen ibuprofen (ADVIL,MOTRIN) 200 MG tablet Take 200-400 mg by mouth every 6 (six)  hours as needed for headache or moderate pain.  . nortriptyline (PAMELOR) 50 MG capsule Take 1 capsule (50 mg total) by mouth at bedtime.  Marland Kitchen omeprazole (PRILOSEC OTC) 20 MG tablet   . pravastatin (PRAVACHOL) 80 MG tablet Take 1 tablet (80 mg total) by mouth daily.   No facility-administered medications prior to visit.    Review of Systems  Constitutional: Negative for fatigue and fever.  HENT: Negative for congestion and sore throat.   Respiratory: Negative for cough and shortness of breath.   Gastrointestinal: Negative for diarrhea.  Musculoskeletal: Negative for joint pain.  Skin: Positive for rash.       Objective    BP 132/84 (BP Location: Left Arm, Patient Position: Sitting, Cuff Size: Normal)   Pulse 96   Temp 98.9 F (37.2 C) (Oral)   Wt 174 lb 3.2 oz (79 kg)   LMP 12/28/1998   SpO2 100%   BMI 28.99 kg/m     Physical Exam Constitutional:      General: She is not in acute distress.    Appearance: Normal appearance. She is not diaphoretic.  HENT:     Head: Normocephalic and atraumatic.  Eyes:     Conjunctiva/sclera: Conjunctivae normal.  Cardiovascular:     Rate and Rhythm: Normal rate and regular rhythm.     Pulses: Normal pulses.     Heart sounds: Normal heart sounds. No murmur heard.   Pulmonary:     Effort: Pulmonary effort is  normal. No respiratory distress.     Breath sounds: Normal breath sounds. No wheezing.  Musculoskeletal:     Cervical back: Neck supple.  Lymphadenopathy:     Cervical: No cervical adenopathy.  Skin:    General: Skin is warm and dry.     Findings: Rash present.     Comments: Erythematous maculopapular rash over face, chest, arms  Neurological:     Mental Status: She is alert and oriented to person, place, and time. Mental status is at baseline.  Psychiatric:        Mood and Affect: Mood normal.        Behavior: Behavior normal.       No results found for any visits on 03/18/21.  Assessment & Plan     Problem List  Items Addressed This Visit      Cardiovascular and Mediastinum   Essential hypertension, benign    Well controlled Continue current medications Recheck metabolic panel F/u in 6 months       Relevant Orders   Comprehensive metabolic panel     Other   Hyperlipidemia    Previously well controlled Continue statin Repeat FLP and CMP today Goal LDL less than 100      Relevant Orders   Lipid panel   Comprehensive metabolic panel   Overweight    Discussed importance of healthy weight management Discussed diet and exercise        Other Visit Diagnoses    Drug rash    -  Primary   Relevant Medications   methylPREDNISolone acetate (DEPO-MEDROL) injection 40 mg (Completed)   methylPREDNISolone acetate (DEPO-MEDROL) injection 40 mg (Completed)   lidocaine (PF) (XYLOCAINE) 1 % injection 2 mL (Completed)   lidocaine (PF) (XYLOCAINE) 1 % injection 2 mL (Completed)    -New problem -Rash appears consistent with drug rash -Discussed that allergy to Tylenol is unlikely, but possible -Would avoid that for the time being -If rash recurs, would suspect diclofenac given that this is a relatively new medications well -No signs of anaphylaxis or breathing difficulty -IM Depo-Medrol given today (of note, was given by CMA with lidocaine, though this was not necessary, but not harmful) -Can use hydroxyzine and triamcinolone ointment as needed for itching -Return precautions discussed   Return in about 6 months (around 09/18/2021) for CPE.      I, Lavon Paganini, MD, have reviewed all documentation for this visit. The documentation on 03/18/21 for the exam, diagnosis, procedures, and orders are all accurate and complete.   Maayan Jenning, Dionne Bucy, MD, MPH [facility]

## 2021-03-18 NOTE — Assessment & Plan Note (Signed)
Well controlled Continue current medications Recheck metabolic panel F/u in 6 months  

## 2021-03-19 LAB — COMPREHENSIVE METABOLIC PANEL
ALT: 35 IU/L — ABNORMAL HIGH (ref 0–32)
AST: 27 IU/L (ref 0–40)
Albumin/Globulin Ratio: 1.5 (ref 1.2–2.2)
Albumin: 4.6 g/dL (ref 3.8–4.8)
Alkaline Phosphatase: 98 IU/L (ref 44–121)
BUN/Creatinine Ratio: 17 (ref 12–28)
BUN: 20 mg/dL (ref 8–27)
Bilirubin Total: 0.6 mg/dL (ref 0.0–1.2)
CO2: 26 mmol/L (ref 20–29)
Calcium: 10.6 mg/dL — ABNORMAL HIGH (ref 8.7–10.3)
Chloride: 96 mmol/L (ref 96–106)
Creatinine, Ser: 1.17 mg/dL — ABNORMAL HIGH (ref 0.57–1.00)
Globulin, Total: 3.1 g/dL (ref 1.5–4.5)
Glucose: 108 mg/dL — ABNORMAL HIGH (ref 65–99)
Potassium: 3.6 mmol/L (ref 3.5–5.2)
Sodium: 139 mmol/L (ref 134–144)
Total Protein: 7.7 g/dL (ref 6.0–8.5)
eGFR: 51 mL/min/{1.73_m2} — ABNORMAL LOW (ref >59)

## 2021-03-19 LAB — LIPID PANEL
Chol/HDL Ratio: 4.2 ratio (ref 0.0–4.4)
Cholesterol, Total: 221 mg/dL — ABNORMAL HIGH (ref 100–199)
HDL: 53 mg/dL (ref >39)
LDL Chol Calc (NIH): 144 mg/dL — ABNORMAL HIGH (ref 0–99)
Triglycerides: 136 mg/dL (ref 0–149)
VLDL Cholesterol Cal: 24 mg/dL (ref 5–40)

## 2021-03-20 ENCOUNTER — Ambulatory Visit: Payer: PRIVATE HEALTH INSURANCE | Admitting: Family Medicine

## 2021-03-20 ENCOUNTER — Encounter: Payer: Self-pay | Admitting: Family Medicine

## 2021-03-21 ENCOUNTER — Telehealth: Payer: Self-pay

## 2021-03-21 NOTE — Telephone Encounter (Signed)
-----   Message from Virginia Crews, MD sent at 03/21/2021  8:25 AM EDT ----- Normal labs, except for increase in cholesterol and creatinine. Taking pravastatin regularly still?  If so, may consider increasing the dose or changing to a higher potency statin like Crestor.  Recommend hydrating well for the creatinine.

## 2021-03-21 NOTE — Telephone Encounter (Signed)
Called patient and no answer. [place] and if patient calls back okay for [facility] to advise.

## 2021-03-21 NOTE — Telephone Encounter (Signed)
Pt given results and recommendations per DR Brita Romp, " Normal labs, except for increase in cholesterol and creatinine. Taking pravastatin regularly still?  If so, may consider increasing the dose or changing to a higher potency statin like Crestor Recommend hydrating well for the creatinine"; the pt says she has been taking her Pravastatin; she says that if the medication is changed the new RX can be set to [place], [place], [place]; the pt also says she sent a message to her PCP because she would like to have all of her medications trantserred there and she needs new RXs; will route to office for notification.

## 2021-03-21 NOTE — Telephone Encounter (Signed)
See nurse response below. KW

## 2021-03-24 ENCOUNTER — Encounter: Payer: Self-pay | Admitting: Family Medicine

## 2021-03-24 ENCOUNTER — Other Ambulatory Visit: Payer: Self-pay | Admitting: Family Medicine

## 2021-03-24 DIAGNOSIS — I10 Essential (primary) hypertension: Secondary | ICD-10-CM

## 2021-03-24 DIAGNOSIS — E782 Mixed hyperlipidemia: Secondary | ICD-10-CM

## 2021-03-25 MED ORDER — ROSUVASTATIN CALCIUM 5 MG PO TABS: 5.0000 mg | ORAL_TABLET | Freq: Every day | ORAL | 3 refills | Status: DC

## 2021-03-25 NOTE — Telephone Encounter (Signed)
Ok to send Rxs to new pharmacy and also d/c pravastatin and start Crestor 5mg  daily #90 r3. Thanks!

## 2021-03-25 NOTE — Addendum Note (Signed)
Addended by: Shawna Orleans on: 03/25/2021 05:15 PM   Modules accepted: Orders

## 2021-04-02 ENCOUNTER — Encounter: Payer: Self-pay | Admitting: Family Medicine

## 2021-04-21 ENCOUNTER — Other Ambulatory Visit: Payer: Self-pay

## 2021-04-21 ENCOUNTER — Encounter: Payer: Self-pay | Admitting: Nurse Practitioner

## 2021-04-21 ENCOUNTER — Ambulatory Visit (INDEPENDENT_AMBULATORY_CARE_PROVIDER_SITE_OTHER): Payer: PRIVATE HEALTH INSURANCE | Admitting: Nurse Practitioner

## 2021-04-21 VITALS — BP 122/76 | Ht 64.0 in | Wt 174.0 lb

## 2021-04-21 DIAGNOSIS — Z01419 Encounter for gynecological examination (general) (routine) without abnormal findings: Secondary | ICD-10-CM | POA: Diagnosis not present

## 2021-04-21 DIAGNOSIS — M8589 Other specified disorders of bone density and structure, multiple sites: Secondary | ICD-10-CM | POA: Diagnosis not present

## 2021-04-21 NOTE — Progress Notes (Signed)
   Katrina Simpson 03/11/1956 [id number]   History:  65 y.o. G3P0004 presents for annual exam without GYN complaints. Postmenopausal - no HRT, no bleeding. 1996 LGSIL, subsequent paps normal. Normal mammogram history. Osteopenia. Plans to have a knee replacement this summer.   Gynecologic History Patient's last menstrual period was 12/28/1998.   Contraception/Family planning: post menopausal status  Health Maintenance Last Pap: 04/17/2020. Results were: normal Last mammogram: 03/11/2018. Results were: normal Last colonoscopy: 2019. Results: Normal. 5-year recall Last Dexa: 10/2020. Results were: T-score -2.1, FRAX 11% / 1.9%  Past medical history, past surgical history, family history and social history were all reviewed and documented in the EPIC chart. Married. 4 sons, 5 grandchildren. 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   ROS:  A ROS was performed and pertinent positives and negatives are included.  Exam:  Vitals:   04/21/21 1123  BP: 122/76  Weight: 174 lb (78.9 kg)  Height: 5\' 4"  (1.626 m)   Body mass index is 29.87 kg/m.  General appearance:  Normal Thyroid:  Symmetrical, normal in size, without palpable masses or nodularity. Respiratory  Auscultation:  Clear without wheezing or rhonchi Cardiovascular  Auscultation:  Regular rate, without rubs, murmurs or gallops  Edema/varicosities:  Not grossly evident Abdominal  Soft,nontender, without masses, guarding or rebound.  Liver/spleen:  No organomegaly noted  Hernia:  None appreciated  Skin  Inspection:  Grossly normal Breasts: Examined lying and sitting.   Right: Without masses, retractions, nipple discharge or axillary adenopathy.   Left: Without masses, retractions, nipple discharge or axillary adenopathy. Gentitourinary   Inguinal/mons:  Normal without inguinal adenopathy  External genitalia:  Scattered sebaceous cysts on both labia majoras. Non-infectious appearing.   BUS/Urethra/Skene's  glands:  Normal  Vagina:  Normal appearing with normal color and discharge, no lesions  Cervix:  Normal appearing without discharge or lesions  Uterus:  Normal in size, shape and contour.  Midline and mobile, nontender  Adnexa/parametria:     Rt: Normal in size, without masses or tenderness.   Lt: Normal in size, without masses or tenderness.  Anus and perineum: Normal  Digital rectal exam: Normal sphincter tone without palpated masses or tenderness  Assessment/Plan:  65 y.o. G3P0004 for annual exam.   Well female exam with routine gynecological exam - Education provided on SBEs, importance of preventative screenings, current guidelines, high calcium diet, regular exercise, and multivitamin daily. Labs with PCP.   Osteopenia of multiple sites - 10/2020 T-score -2.1. Continue daily Vitamin D supplement and regular exercise as tolerated. She has struggled with exercise due to right knee pain - plans to have a replacement this summer.   Screening for cervical cancer - Normal Pap history. Discussed current guidelines and the option to stop screening and she is agreeable. We will reassess on an annual basis.   Screening for breast cancer - Normal mammogram history.  She is overdue and plans to schedule this soon. Normal breast exam today.  Screening for colon cancer - 2019 colonoscopy. Will repeat at 5-year interval per GI's recommended .  Return in 1 year for annual.     Tamela Gammon DNP, 11:39 AM 04/21/2021

## 2021-04-21 NOTE — Patient Instructions (Addendum)
Schedule mammogram! [facility] [phone] [address] [place], [place] [address]  Health Maintenance After Age 67 After age 64, you are at a higher risk for certain long-term diseases and infections as well as injuries from falls. Falls are a major cause of broken bones and head injuries in people who are older than age 61. Getting regular preventive care can help to keep you healthy and well. Preventive care includes getting regular testing and making lifestyle changes as recommended by your health care provider. Talk with your health care provider about:  Which screenings and tests you should have. A screening is a test that checks for a disease when you have no symptoms.  A diet and exercise plan that is right for you. What should I know about screenings and tests to prevent falls? Screening and testing are the best ways to find a health problem early. Early diagnosis and treatment give you the best chance of managing medical conditions that are common after age 53. Certain conditions and lifestyle choices may make you more likely to have a fall. Your health care provider may recommend:  Regular vision checks. Poor vision and conditions such as cataracts can make you more likely to have a fall. If you wear glasses, make sure to get your prescription updated if your vision changes.  Medicine review. Work with your health care provider to regularly review all of the medicines you are taking, including over-the-counter medicines. Ask your health care provider about any side effects that may make you more likely to have a fall. Tell your health care provider if any medicines that you take make you feel dizzy or sleepy.  Osteoporosis screening. Osteoporosis is a condition that causes the bones to get weaker. This can make the bones weak and cause them to break more easily.  Blood pressure screening. Blood pressure changes and medicines to control blood pressure  can make you feel dizzy.  Strength and balance checks. Your health care provider may recommend certain tests to check your strength and balance while standing, walking, or changing positions.  Foot health exam. Foot pain and numbness, as well as not wearing proper footwear, can make you more likely to have a fall.  Depression screening. You may be more likely to have a fall if you have a fear of falling, feel emotionally low, or feel unable to do activities that you used to do.  Alcohol use screening. Using too much alcohol can affect your balance and may make you more likely to have a fall. What actions can I take to lower my risk of falls? General instructions  Talk with your health care provider about your risks for falling. Tell your health care provider if: ? You fall. Be sure to tell your health care provider about all falls, even ones that seem minor. ? You feel dizzy, sleepy, or off-balance.  Take over-the-counter and prescription medicines only as told by your health care provider. These include any supplements.  Eat a healthy diet and maintain a healthy weight. A healthy diet includes low-fat dairy products, low-fat (lean) meats, and fiber from whole grains, beans, and lots of fruits and vegetables. Home safety  Remove any tripping hazards, such as rugs, cords, and clutter.  Install safety equipment such as grab bars in bathrooms and safety rails on stairs.  Keep rooms and walkways well-lit. Activity  Follow a regular exercise program to stay fit. This will help you maintain your balance. Ask your  health care provider what types of exercise are appropriate for you.  If you need a cane or walker, use it as recommended by your health care provider.  Wear supportive shoes that have nonskid soles.   Lifestyle  Do not drink alcohol if your health care provider tells you not to drink.  If you drink alcohol, limit how much you have: ? 0-1 drink a day for women. ? 0-2 drinks  a day for men.  Be aware of how much alcohol is in your drink. In the [place] one drink equals one typical bottle of beer (12 oz), one-half glass of wine (5 oz), or one shot of hard liquor (1 oz).  Do not use any products that contain nicotine or tobacco, such as cigarettes and e-cigarettes. If you need help quitting, ask your health care provider. Summary  Having a healthy lifestyle and getting preventive care can help to protect your health and wellness after age 36.  Screening and testing are the best way to find a health problem early and help you avoid having a fall. Early diagnosis and treatment give you the best chance for managing medical conditions that are more common for people who are older than age 58.  Falls are a major cause of broken bones and head injuries in people who are older than age 90. Take precautions to prevent a fall at home.  Work with your health care provider to learn what changes you can make to improve your health and wellness and to prevent falls. This information is not intended to replace advice given to you by your health care provider. Make sure you discuss any questions you have with your health care provider. Document Revised: 04/06/2019 Document Reviewed: 10/27/2017 Elsevier Patient Education  2021 [employer].

## 2021-06-03 ENCOUNTER — Other Ambulatory Visit: Payer: Self-pay | Admitting: Surgery

## 2021-06-16 ENCOUNTER — Other Ambulatory Visit: Payer: Self-pay

## 2021-06-16 ENCOUNTER — Other Ambulatory Visit
Admission: RE | Admit: 2021-06-16 | Discharge: 2021-06-16 | Disposition: A | Discharge status: Home / Self Care | Payer: PRIVATE HEALTH INSURANCE | Source: Ambulatory Visit | Attending: Surgery | Admitting: Surgery

## 2021-06-16 DIAGNOSIS — Z01818 Encounter for other preprocedural examination: Secondary | ICD-10-CM | POA: Insufficient documentation

## 2021-06-16 HISTORY — DX: Headache, unspecified: R51.9

## 2021-06-16 HISTORY — DX: Gastro-esophageal reflux disease without esophagitis: K21.9

## 2021-06-16 HISTORY — DX: Unspecified osteoarthritis, unspecified site: M19.90

## 2021-06-16 HISTORY — DX: Inflammatory liver disease, unspecified: K75.9

## 2021-06-16 LAB — COMPREHENSIVE METABOLIC PANEL
ALT: 57 U/L — ABNORMAL HIGH (ref 0–44)
AST: 40 U/L (ref 15–41)
Albumin: 4.4 g/dL (ref 3.5–5.0)
Alkaline Phosphatase: 75 U/L (ref 38–126)
Anion gap: 10 (ref 5–15)
BUN: 18 mg/dL (ref 8–23)
CO2: 29 mmol/L (ref 22–32)
Calcium: 9.6 mg/dL (ref 8.9–10.3)
Chloride: 99 mmol/L (ref 98–111)
Creatinine, Ser: 0.99 mg/dL (ref 0.44–1.00)
GFR, Estimated: >60 mL/min (ref >60)
Glucose, Bld: 96 mg/dL (ref 70–99)
Potassium: 2.8 mmol/L — ABNORMAL LOW (ref 3.5–5.1)
Sodium: 138 mmol/L (ref 135–145)
Total Bilirubin: 0.9 mg/dL (ref 0.3–1.2)
Total Protein: 7.6 g/dL (ref 6.5–8.1)

## 2021-06-16 LAB — CBC WITH DIFFERENTIAL/PLATELET
Abs Immature Granulocytes: 0.01 10*3/uL (ref 0.00–0.07)
Basophils Absolute: 0.1 10*3/uL (ref 0.0–0.1)
Basophils Relative: 1 %
Eosinophils Absolute: 0.1 10*3/uL (ref 0.0–0.5)
Eosinophils Relative: 2 %
HCT: 42.7 % (ref 36.0–46.0)
Hemoglobin: 15.1 g/dL — ABNORMAL HIGH (ref 12.0–15.0)
Immature Granulocytes: 0 %
Lymphocytes Relative: 32 %
Lymphs Abs: 2.6 10*3/uL (ref 0.7–4.0)
MCH: 31.5 pg (ref 26.0–34.0)
MCHC: 35.4 g/dL (ref 30.0–36.0)
MCV: 89.1 fL (ref 80.0–100.0)
Monocytes Absolute: 0.5 10*3/uL (ref 0.1–1.0)
Monocytes Relative: 6 %
Neutro Abs: 4.8 10*3/uL (ref 1.7–7.7)
Neutrophils Relative %: 59 %
Platelets: 226 10*3/uL (ref 150–400)
RBC: 4.79 MIL/uL (ref 3.87–5.11)
RDW: 12.5 % (ref 11.5–15.5)
WBC: 8.1 10*3/uL (ref 4.0–10.5)
nRBC: 0 % (ref 0.0–0.2)

## 2021-06-16 LAB — URINALYSIS, ROUTINE W REFLEX MICROSCOPIC
Bilirubin Urine: NEGATIVE
Glucose, UA: NEGATIVE mg/dL
Hgb urine dipstick: NEGATIVE
Ketones, ur: NEGATIVE mg/dL
Nitrite: NEGATIVE
Protein, ur: NEGATIVE mg/dL
Specific Gravity, Urine: 1.014 (ref 1.005–1.030)
pH: 5 (ref 5.0–8.0)

## 2021-06-16 LAB — TYPE AND SCREEN
ABO/RH(D): A NEG
Antibody Screen: NEGATIVE

## 2021-06-16 LAB — SURGICAL PCR SCREEN
MRSA, PCR: NEGATIVE
Staphylococcus aureus: NEGATIVE

## 2021-06-16 NOTE — Patient Instructions (Addendum)
Your procedure is scheduled on:06-24-21 TUESDAY Report to the Registration Desk on the 1st floor of the [facility]-Then proceed to the 2nd floor Surgery Desk in the [facility] To find out your arrival time, please call [phone] between 1PM - 3PM on:06-23-21 MONDAY  REMEMBER: Instructions that are not followed completely may result in serious medical risk, up to and including death; or upon the discretion of your surgeon and anesthesiologist your surgery may need to be rescheduled.  Do not eat food after midnight the night before surgery.  No gum chewing, lozengers or hard candies.  You may however, drink CLEAR liquids up to 2 hours before you are scheduled to arrive for your surgery. Do not drink anything within 2 hours of your scheduled arrival time.  Clear liquids include: - water  - apple juice without pulp - gatorade - black coffee or tea (Do NOT add milk or creamers to the coffee or tea) Do NOT drink anything that is not on this list.  In addition, your doctor has ordered for you to drink the provided  Ensure Pre-Surgery Clear Carbohydrate Drink  Drinking this carbohydrate drink up to two hours before surgery helps to reduce insulin resistance and improve patient outcomes. Please complete drinking 2 hours prior to scheduled arrival time.  TAKE THESE MEDICATIONS THE MORNING OF SURGERY WITH A SIP OF WATER: -Prilosec (Omeprazole)-take one the night before and one on the morning of surgery - helps to prevent nausea after surgery.)  One week prior to surgery: Stop Anti-inflammatories (NSAIDS) such as Diclofenac (Volataren), Advil, Aleve, Ibuprofen, Motrin, Naproxen, Naprosyn and Aspirin based products such as Excedrin, Goodys Powder, BC Powder.You may however, continue to take Tylenol if needed for pain up until the day of surgery.  Stop ANY OVER THE COUNTER supplements/vitamins NOW (06-16-21) until after surgery.  No Alcohol for 24 hours before or after surgery.  No Smoking  including e-cigarettes for 24 hours prior to surgery.  No chewable tobacco products for at least 6 hours prior to surgery.  No nicotine patches on the day of surgery.  Do not use any "recreational" drugs for at least a week prior to your surgery.  Please be advised that the combination of cocaine and anesthesia may have negative outcomes, up to and including death. If you test positive for cocaine, your surgery will be cancelled.  On the morning of surgery brush your teeth with toothpaste and water, you may rinse your mouth with mouthwash if you wish. Do not swallow any toothpaste or mouthwash.  Do not wear jewelry, make-up, hairpins, clips or nail polish.  Do not wear lotions, powders, or perfumes.   Do not shave body from the neck down 48 hours prior to surgery just in case you cut yourself which could leave a site for infection.  Also, freshly shaved skin may become irritated if using the CHG soap.  Contact lenses, hearing aids and dentures may not be worn into surgery.  Do not bring valuables to the hospital. [facility] is not responsible for any missing/lost belongings or valuables.   Use CHG Soap as directed on instruction sheet  Notify your doctor if there is any change in your medical condition (cold, fever, infection).  Wear comfortable clothing (specific to your surgery type) to the hospital.  After surgery, you can help prevent lung complications by doing breathing exercises.  Take deep breaths and cough every 1-2 hours. Your doctor may order a device called an Incentive Spirometer to help you take deep  breaths. When coughing or sneezing, hold a pillow firmly against your incision with both hands. This is called "splinting." Doing this helps protect your incision. It also decreases belly discomfort.  If you are being admitted to the hospital overnight, leave your suitcase in the car. After surgery it may be brought to your room.  If you are being discharged the day of  surgery, you will not be allowed to drive home. You will need a responsible adult (18 years or older) to drive you home and stay with you that night.   If you are taking public transportation, you will need to have a responsible adult (18 years or older) with you. Please confirm with your physician that it is acceptable to use public transportation.   Please call the [place] Dept. at [phone] if you have any questions about these instructions.  Surgery Visitation Policy:  Patients undergoing a surgery or procedure may have one family member or support person with them as long as that person is not COVID-19 positive or experiencing its symptoms.  That person may remain in the waiting area during the procedure.  Inpatient Visitation:    Visiting hours are 7 a.m. to 8 p.m. Inpatients will be allowed two visitors daily. The visitors may change each day during the patient's stay. No visitors under the age of 38. Any visitor under the age of 14 must be accompanied by an adult. The visitor must pass COVID-19 screenings, use hand sanitizer when entering and exiting the patient's room and wear a mask at all times, including in the patient's room. Patients must also wear a mask when staff or their visitor are in the room. Masking is required regardless of vaccination status.

## 2021-06-16 NOTE — Progress Notes (Signed)
  Perioperative Services Pre-Admission/Anesthesia Testing     Date: 06/16/21  Name: Katrina Simpson MRN:   [id number]  Re: Change in [place] for upcoming surgery   Case: [id number] Date/Time: 06/24/21 1024   Procedure: TOTAL KNEE ARTHROPLASTY (Right: Knee)   Anesthesia type: Choice   Pre-op diagnosis: PRIMARY OSTEOARTHRITIS OF RIGHT KNEE.   Location: [facility] OR ROOM 03 / [place] ORS FOR ANESTHESIA GROUP   Surgeons: Corky Mull, MD     Primary attending surgeon was consulted regarding consideration of therapeutic change in antimicrobial agent being used for preoperative prophylaxis in this patient's upcoming surgical case. Following analysis of the risk versus benefits, Dr. Roland Rack, Marshall Cork, MD advising that it would be acceptable to discontinue the ordered clindamycin and place an order for cefazolin 2 gm IV on call to the OR. Orders for this patient were amended by me following collaborative conversation with attending surgeon.  Honor Loh, MSN, APRN, FNP-C, CEN [place]  Peri-operative Services Nurse Practitioner Phone: [phone] 06/16/21 3:35 PM

## 2021-06-16 NOTE — Progress Notes (Signed)
Perioperative Services Pre-Admission/Anesthesia Testing   Date: 06/16/21 Name: Katrina Simpson MRN:   [id number]  Re: Consideration of preoperative prophylactic antibiotic change   Request sent to: Poggi, Marshall Cork, MD (routed and/or faxed via [facility])  Planned Surgical Procedure(s):    Case: [id number] Date/Time: 06/24/21 1024   Procedure: TOTAL KNEE ARTHROPLASTY (Right: Knee)   Anesthesia type: Choice   Pre-op diagnosis: PRIMARY OSTEOARTHRITIS OF RIGHT KNEE.   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    Screened as appropriate for cephalosporin use during medication reconciliation No immediate angioedema, dysphagia, SOB, anaphylaxis symptoms. No severe rash involving mucous membranes or skin necrosis. No hospital admissions related to side effects of PCN/cephalosporin use.  No documented reaction to PCN or cephalosporin in the last 10 years.  Request:  As an evidence based approach to reducing the rate of incidence for post-operative SSI and the development of MDROs, could an agent with narrower coverage for preoperative prophylaxis in this patient's upcoming surgical course be considered?   Currently ordered preoperative prophylactic ABX: clindamycin.   Specifically requesting change to cephalosporin (CEFAZOLIN).   Please communicate decision with me and I will change the orders in Epic as per your direction.   Things to consider: Many patients report that they were "allergic" to PCN earlier in life, however this does not translate into a true lifelong allergy. Patients can lose sensitivity to specific IgE antibodies over time if PCN is avoided (Kleris & Lugar, 2019).  Up to 10% of the adult population and 15% of hospitalized patients report an allergy to PCN, however  clinical studies suggest that 90% of those reporting an allergy can tolerate PCN antibiotics (Kleris & Lugar, 2019).  Cross-sensitivity between PCN and cephalosporins has been documented as being as high as 10%, however this estimation included data believed to have been collected in a setting where there was contamination. Newer data suggests that the prevalence of cross-sensitivity between PCN and cephalosporins is actually estimated to be closer to 1% (Hermanides et al., 2018).   Patients labeled as PCN allergic, whether they are truly allergic or not, have been found to have inferior outcomes in terms of rates of serious infection, and these patients tend to have longer hospital stays ([place], 2019).  Treatment related secondary infections, such as Clostridioides difficile, have been linked to the improper use of broad spectrum antibiotics in patients improperly labeled as PCN allergic (Kleris & Lugar, 2019).  Anaphylaxis from cephalosporins is rare and the evidence suggests that there is no increased risk of an anaphylactic type reaction when cephalosporins are used in a PCN allergic patient (Pichichero, 2006).  Citations: Hermanides J, Lemkes BA, Prins Pearla Dubonnet MW, Terreehorst I. Presumed ?-Lactam Allergy and Cross-reactivity in the Operating Theater: A Practical Approach. Anesthesiology. 2018 Aug;129(2):335-342. doi: 10.1097/ALN.0000000000002252. PMID: 00867619.  Kleris, Graham., & Lugar, P. L. (2019). Things We Do For No Reason: Failing to Question a Penicillin Allergy History. Journal of hospital medicine, 14(10), [phone]. Advance online publication. https://www.wallace-m[place]leton.info/  Pichichero, M. E. (2006). Cephalosporins can be prescribed safely for penicillin-allergic patients. Journal of family medicine, 55(2), 106-112. Accessed: https://cdn.mdedge.com/files/s74fs-public/Document/September-2017/5502JFP_AppliedEvidence1.pdf   Honor Loh, MSN, APRN, FNP-C, [place]  Peri-operative Services Nurse Practitioner FAX: [phone] 06/16/21  11:51 AM

## 2021-06-16 NOTE — Progress Notes (Addendum)
  [facility] Perioperative Services: Pre-Admission/Anesthesia Testing  Abnormal Lab Notification   Date: 06/16/21  Name: Katrina Simpson MRN:   [id number]  Re: Abnormal labs noted during PAT appointment   Provider(s) Notified: Poggi, Marshall Cork, MD Notification mode: Routed and/or faxed via [place] LAB VALUE(S): Lab Results  Component Value Date   K 2.8 (L) 06/16/2021   Notes:  Patient is scheduled for a TOTAL KNEE ARTHROPLASTY (Right: Knee) on 06/24/2021. Patient is on daily chlorthalidone 25 mg.  Sending result to primary attending surgeon for review and optimization.  Order placed for repeat K+ level on the day of patient's procedure to ensure optimization.    This is a Community education officer; no formal response is required.  Honor Loh, MSN, APRN, FNP-C, CEN [facility]  Peri-operative Services Nurse Practitioner Phone: [phone] Fax: [phone] 06/16/21 1:15 PM

## 2021-06-20 ENCOUNTER — Other Ambulatory Visit: Payer: Self-pay

## 2021-06-20 ENCOUNTER — Other Ambulatory Visit
Admission: RE | Admit: 2021-06-20 | Discharge: 2021-06-20 | Disposition: A | Discharge status: Home / Self Care | Payer: No Typology Code available for payment source | Source: Ambulatory Visit | Attending: Surgery | Admitting: Surgery

## 2021-06-20 DIAGNOSIS — Z01812 Encounter for preprocedural laboratory examination: Secondary | ICD-10-CM | POA: Diagnosis not present

## 2021-06-20 DIAGNOSIS — Z20822 Contact with and (suspected) exposure to covid-19: Secondary | ICD-10-CM | POA: Diagnosis not present

## 2021-06-20 LAB — SARS CORONAVIRUS 2 (TAT 6-24 HRS): SARS Coronavirus 2: NEGATIVE

## 2021-06-24 ENCOUNTER — Ambulatory Visit
Admission: RE | Admit: 2021-06-24 | Discharge: 2021-06-24 | Disposition: A | Discharge status: Home / Self Care | Payer: PRIVATE HEALTH INSURANCE | Attending: Surgery | Admitting: Surgery

## 2021-06-24 ENCOUNTER — Other Ambulatory Visit: Payer: Self-pay | Admitting: Family Medicine

## 2021-06-24 ENCOUNTER — Ambulatory Visit: Payer: PRIVATE HEALTH INSURANCE | Admitting: Urgent Care

## 2021-06-24 ENCOUNTER — Ambulatory Visit: Payer: PRIVATE HEALTH INSURANCE

## 2021-06-24 ENCOUNTER — Encounter
Admission: RE | Disposition: A | Discharge status: Home / Self Care | Payer: Self-pay | Source: Home / Self Care | Attending: Surgery

## 2021-06-24 ENCOUNTER — Encounter: Payer: Self-pay | Admitting: Surgery

## 2021-06-24 ENCOUNTER — Other Ambulatory Visit: Payer: Self-pay

## 2021-06-24 DIAGNOSIS — Z881 Allergy status to other antibiotic agents status: Secondary | ICD-10-CM | POA: Insufficient documentation

## 2021-06-24 DIAGNOSIS — Z882 Allergy status to sulfonamides status: Secondary | ICD-10-CM | POA: Insufficient documentation

## 2021-06-24 DIAGNOSIS — Z79899 Other long term (current) drug therapy: Secondary | ICD-10-CM | POA: Insufficient documentation

## 2021-06-24 DIAGNOSIS — M1711 Unilateral primary osteoarthritis, right knee: Secondary | ICD-10-CM | POA: Insufficient documentation

## 2021-06-24 DIAGNOSIS — I10 Essential (primary) hypertension: Secondary | ICD-10-CM

## 2021-06-24 DIAGNOSIS — Z880 Allergy status to penicillin: Secondary | ICD-10-CM | POA: Insufficient documentation

## 2021-06-24 DIAGNOSIS — Z96651 Presence of right artificial knee joint: Secondary | ICD-10-CM

## 2021-06-24 HISTORY — PX: TOTAL KNEE ARTHROPLASTY: SHX125

## 2021-06-24 LAB — POCT I-STAT, CHEM 8
BUN: 13 mg/dL (ref 8–23)
Calcium, Ion: 1.19 mmol/L (ref 1.15–1.40)
Chloride: 100 mmol/L (ref 98–111)
Creatinine, Ser: 1.1 mg/dL — ABNORMAL HIGH (ref 0.44–1.00)
Glucose, Bld: 79 mg/dL (ref 70–99)
HCT: 43 % (ref 36.0–46.0)
Hemoglobin: 14.6 g/dL (ref 12.0–15.0)
Potassium: 4.1 mmol/L (ref 3.5–5.1)
Sodium: 140 mmol/L (ref 135–145)
TCO2: 27 mmol/L (ref 22–32)

## 2021-06-24 LAB — ABO/RH: ABO/RH(D): A NEG

## 2021-06-24 SURGERY — ARTHROPLASTY, KNEE, TOTAL
Anesthesia: Spinal | Site: Knee | Laterality: Right

## 2021-06-24 MED ORDER — ONDANSETRON HCL 4 MG/2ML IJ SOLN: 4.0000 mg | Freq: Four times a day (QID) | INTRAMUSCULAR | Status: DC | PRN

## 2021-06-24 MED ORDER — MIDAZOLAM HCL 5 MG/5ML IJ SOLN
INTRAMUSCULAR | Status: DC | PRN
  Administered 2021-06-24: 2 mg via INTRAVENOUS

## 2021-06-24 MED ORDER — FENTANYL CITRATE (PF) 100 MCG/2ML IJ SOLN
INTRAMUSCULAR | Status: DC | PRN
  Administered 2021-06-24 (×2): 50 ug via INTRAVENOUS

## 2021-06-24 MED ORDER — SODIUM CHLORIDE 0.9 % BOLUS PEDS: 250.0000 mL | Freq: Once | INTRAVENOUS | Status: DC

## 2021-06-24 MED ORDER — KETOROLAC TROMETHAMINE 15 MG/ML IJ SOLN
INTRAMUSCULAR | Status: AC
  Administered 2021-06-24: 15 mg via INTRAVENOUS
  Filled 2021-06-24: qty 1

## 2021-06-24 MED ORDER — KETOROLAC TROMETHAMINE 15 MG/ML IJ SOLN: 15.0000 mg | Freq: Once | INTRAMUSCULAR | Status: AC

## 2021-06-24 MED ORDER — OXYCODONE HCL 5 MG PO TABS
5.0000 mg | ORAL_TABLET | ORAL | Status: DC | PRN
Start: 2021-06-24 — End: 2021-06-24

## 2021-06-24 MED ORDER — SODIUM CHLORIDE 0.9 % IR SOLN
Status: DC | PRN
  Administered 2021-06-24: 3000 mL

## 2021-06-24 MED ORDER — BUPIVACAINE HCL (PF) 0.5 % IJ SOLN
INTRAMUSCULAR | Status: DC | PRN
  Administered 2021-06-24: 2.5 mL via INTRATHECAL

## 2021-06-24 MED ORDER — SODIUM CHLORIDE 0.9 % IV SOLN: INTRAVENOUS | Status: DC

## 2021-06-24 MED ORDER — ONDANSETRON HCL 4 MG/2ML IJ SOLN
4.0000 mg | Freq: Once | INTRAMUSCULAR | Status: AC | PRN
  Administered 2021-06-24: 4 mg via INTRAVENOUS

## 2021-06-24 MED ORDER — ACETAMINOPHEN 500 MG PO TABS: 1000.0000 mg | ORAL_TABLET | Freq: Four times a day (QID) | ORAL | Status: DC

## 2021-06-24 MED ORDER — PROPOFOL 500 MG/50ML IV EMUL
INTRAVENOUS | Status: DC | PRN
  Administered 2021-06-24: 50 ug/kg/min via INTRAVENOUS

## 2021-06-24 MED ORDER — ONDANSETRON HCL 4 MG/2ML IJ SOLN
INTRAMUSCULAR | Status: AC
  Filled 2021-06-24: qty 2

## 2021-06-24 MED ORDER — TRANEXAMIC ACID 1000 MG/10ML IV SOLN
INTRAVENOUS | Status: DC | PRN
  Administered 2021-06-24: 2000 mg via TOPICAL

## 2021-06-24 MED ORDER — PROPOFOL 1000 MG/100ML IV EMUL
INTRAVENOUS | Status: AC
  Filled 2021-06-24: qty 100

## 2021-06-24 MED ORDER — CHLORHEXIDINE GLUCONATE 0.12 % MT SOLN: 15.0000 mL | Freq: Once | OROMUCOSAL | Status: DC

## 2021-06-24 MED ORDER — ROSUVASTATIN CALCIUM 5 MG PO TABS: 5.0000 mg | ORAL_TABLET | Freq: Every day | ORAL | 0 refills | Status: DC

## 2021-06-24 MED ORDER — SODIUM CHLORIDE 0.9 % BOLUS PEDS
250.0000 mL | Freq: Once | INTRAVENOUS | Status: AC
  Administered 2021-06-24: 250 mL via INTRAVENOUS

## 2021-06-24 MED ORDER — LACTATED RINGERS IV SOLN: INTRAVENOUS | Status: DC

## 2021-06-24 MED ORDER — CEFAZOLIN SODIUM-DEXTROSE 2-4 GM/100ML-% IV SOLN
INTRAVENOUS | Status: AC
  Filled 2021-06-24: qty 100

## 2021-06-24 MED ORDER — METOCLOPRAMIDE HCL 5 MG/ML IJ SOLN: 5.0000 mg | Freq: Three times a day (TID) | INTRAMUSCULAR | Status: DC | PRN

## 2021-06-24 MED ORDER — BUPIVACAINE-EPINEPHRINE (PF) 0.5% -1:200000 IJ SOLN
INTRAMUSCULAR | Status: DC | PRN
  Administered 2021-06-24: 30 mL

## 2021-06-24 MED ORDER — FENTANYL CITRATE (PF) 100 MCG/2ML IJ SOLN
INTRAMUSCULAR | Status: AC
  Filled 2021-06-24: qty 2

## 2021-06-24 MED ORDER — SODIUM CHLORIDE 0.9 % IV SOLN
INTRAVENOUS | Status: DC | PRN
  Administered 2021-06-24: 25 ug/min via INTRAVENOUS

## 2021-06-24 MED ORDER — METOCLOPRAMIDE HCL 10 MG PO TABS
5.0000 mg | ORAL_TABLET | Freq: Three times a day (TID) | ORAL | Status: DC | PRN
Start: 2021-06-24 — End: 2021-06-24

## 2021-06-24 MED ORDER — APIXABAN 2.5 MG PO TABS: 2.5000 mg | ORAL_TABLET | Freq: Two times a day (BID) | ORAL | 0 refills | Status: DC

## 2021-06-24 MED ORDER — CEFAZOLIN SODIUM-DEXTROSE 2-4 GM/100ML-% IV SOLN
2.0000 g | Freq: Four times a day (QID) | INTRAVENOUS | Status: DC
  Administered 2021-06-24: 2 g via INTRAVENOUS

## 2021-06-24 MED ORDER — OXYCODONE HCL 5 MG PO TABS: 5.0000 mg | ORAL_TABLET | ORAL | 0 refills | Status: DC | PRN

## 2021-06-24 MED ORDER — CEFAZOLIN SODIUM-DEXTROSE 2-4 GM/100ML-% IV SOLN
2.0000 g | Freq: Once | INTRAVENOUS | Status: AC
  Administered 2021-06-24: 2 g via INTRAVENOUS

## 2021-06-24 MED ORDER — SODIUM CHLORIDE FLUSH 0.9 % IV SOLN
INTRAVENOUS | Status: AC
  Filled 2021-06-24: qty 20

## 2021-06-24 MED ORDER — MIDAZOLAM HCL 2 MG/2ML IJ SOLN
INTRAMUSCULAR | Status: AC
  Filled 2021-06-24: qty 2

## 2021-06-24 MED ORDER — ONDANSETRON HCL 4 MG PO TABS: 4.0000 mg | ORAL_TABLET | Freq: Four times a day (QID) | ORAL | Status: DC | PRN

## 2021-06-24 MED ORDER — KETOROLAC TROMETHAMINE 15 MG/ML IJ SOLN: 7.5000 mg | Freq: Four times a day (QID) | INTRAMUSCULAR | Status: DC

## 2021-06-24 MED ORDER — SODIUM CHLORIDE 0.9 % IV SOLN
INTRAVENOUS | Status: DC | PRN
  Administered 2021-06-24: 60 mL

## 2021-06-24 MED ORDER — FENTANYL CITRATE (PF) 100 MCG/2ML IJ SOLN: 25.0000 ug | INTRAMUSCULAR | Status: DC | PRN

## 2021-06-24 MED ORDER — ORAL CARE MOUTH RINSE: 15.0000 mL | Freq: Once | OROMUCOSAL | Status: DC

## 2021-06-24 MED ORDER — CHLORTHALIDONE 25 MG PO TABS: 25.0000 mg | ORAL_TABLET | Freq: Every day | ORAL | 0 refills | Status: DC

## 2021-06-24 SURGICAL SUPPLY — 60 items
APL PRP STRL LF DISP 70% ISPRP (MISCELLANEOUS) ×1
BLADE SAW SAG 25X90X1.19 (BLADE) ×2 IMPLANT
BLADE SURG SZ20 CARB STEEL (BLADE) ×2 IMPLANT
BNDG CMPR STD VLCR NS LF 5.8X6 (GAUZE/BANDAGES/DRESSINGS) ×1
BNDG ELASTIC 6X5.8 VLCR NS LF (GAUZE/BANDAGES/DRESSINGS) ×2 IMPLANT
BRNG TIB 67X12 ANT STAB MDLR (Insert) ×1 IMPLANT
CEMENT BONE R 1X40 (Cement) ×4 IMPLANT
CEMENT VACUUM MIXING SYSTEM (MISCELLANEOUS) ×2 IMPLANT
CHLORAPREP W/TINT 26 (MISCELLANEOUS) ×2 IMPLANT
COMP PAT 3 PEG SERIES A 31/6.2 (Miscellaneous) ×2 IMPLANT
COMPONENT PAT3 PEG SERS 31/6.2 (Miscellaneous) IMPLANT
COOLER POLAR GLACIER W/PUMP (MISCELLANEOUS) ×2 IMPLANT
COVER MAYO STAND REUSABLE (DRAPES) ×2 IMPLANT
COVER WAND RF STERILE (DRAPES) ×2 IMPLANT
CUFF TOURN SGL QUICK 34 (TOURNIQUET CUFF) ×2
CUFF TRNQT CYL 34X4.125X (TOURNIQUET CUFF) IMPLANT
DRAPE 3/4 80X56 (DRAPES) ×2 IMPLANT
DRAPE IMP U-DRAPE 54X76 (DRAPES) ×2 IMPLANT
DRSG OPSITE POSTOP 4X10 (GAUZE/BANDAGES/DRESSINGS) ×2 IMPLANT
DRSG OPSITE POSTOP 4X8 (GAUZE/BANDAGES/DRESSINGS) ×2 IMPLANT
ELECT REM PT RETURN 9FT ADLT (ELECTROSURGICAL) ×2
ELECTRODE REM PT RTRN 9FT ADLT (ELECTROSURGICAL) ×1 IMPLANT
GAUZE 4X4 16PLY ~~LOC~~+RFID DBL (SPONGE) ×2 IMPLANT
GAUZE XEROFORM 1X8 LF (GAUZE/BANDAGES/DRESSINGS) ×2 IMPLANT
GLOVE SRG 8 PF TXTR STRL LF DI (GLOVE) ×1 IMPLANT
GLOVE SURG ENC MOIS LTX SZ7.5 (GLOVE) ×8 IMPLANT
GLOVE SURG ENC MOIS LTX SZ8 (GLOVE) ×8 IMPLANT
GLOVE SURG UNDER LTX SZ8 (GLOVE) ×2 IMPLANT
GLOVE SURG UNDER POLY LF SZ8 (GLOVE) ×2
GOWN STRL REUS W/ TWL LRG LVL3 (GOWN DISPOSABLE) ×1 IMPLANT
GOWN STRL REUS W/ TWL XL LVL3 (GOWN DISPOSABLE) ×1 IMPLANT
GOWN STRL REUS W/TWL LRG LVL3 (GOWN DISPOSABLE) ×2
GOWN STRL REUS W/TWL XL LVL3 (GOWN DISPOSABLE) ×2
HOOD PEEL AWAY FLYTE STAYCOOL (MISCELLANEOUS) ×6 IMPLANT
INSERT TIB BEARING 67X12 (Insert) ×1 IMPLANT
IV NS IRRIG 3000ML ARTHROMATIC (IV SOLUTION) ×2 IMPLANT
KIT TURNOVER KIT A (KITS) ×2 IMPLANT
KNEE CR FEMORAL RT 62.5MM (Femur) ×1 IMPLANT
MANIFOLD NEPTUNE II (INSTRUMENTS) ×2 IMPLANT
NDL SPNL 20GX3.5 QUINCKE YW (NEEDLE) ×1 IMPLANT
NEEDLE SPNL 20GX3.5 QUINCKE YW (NEEDLE) ×2 IMPLANT
NS IRRIG 1000ML POUR BTL (IV SOLUTION) ×2 IMPLANT
PACK TOTAL KNEE (MISCELLANEOUS) ×2 IMPLANT
PAD WRAPON POLAR KNEE (MISCELLANEOUS) ×1 IMPLANT
PENCIL SMOKE EVACUATOR (MISCELLANEOUS) ×2 IMPLANT
PLATE INTERLOK 6700 (Plate) ×1 IMPLANT
PULSAVAC PLUS IRRIG FAN TIP (DISPOSABLE) ×2
SPONGE T-LAP 18X18 ~~LOC~~+RFID (SPONGE) ×6 IMPLANT
STAPLER SKIN PROX 35W (STAPLE) ×2 IMPLANT
SUCTION FRAZIER HANDLE 10FR (MISCELLANEOUS) ×2
SUCTION TUBE FRAZIER 10FR DISP (MISCELLANEOUS) ×1 IMPLANT
SUT VIC AB 0 CT1 36 (SUTURE) ×6 IMPLANT
SUT VIC AB 2-0 CT1 27 (SUTURE) ×8
SUT VIC AB 2-0 CT1 TAPERPNT 27 (SUTURE) ×3 IMPLANT
SYR 10ML LL (SYRINGE) ×2 IMPLANT
SYR 20ML LL LF (SYRINGE) ×2 IMPLANT
SYR 30ML LL (SYRINGE) ×1 IMPLANT
TIP FAN IRRIG PULSAVAC PLUS (DISPOSABLE) ×1 IMPLANT
TRAP FLUID SMOKE EVACUATOR (MISCELLANEOUS) ×2 IMPLANT
WRAPON POLAR PAD KNEE (MISCELLANEOUS) ×2

## 2021-06-24 NOTE — Anesthesia Procedure Notes (Signed)
Spinal  Patient location during procedure: OR Start time: 06/24/2021 10:35 AM End time: 06/24/2021 10:41 AM Reason for block: surgical anesthesia Staffing Performed: anesthesiologist  Anesthesiologist: Emmie Niemann, MD Preanesthetic Checklist Completed: patient identified, IV checked, site marked, risks and benefits discussed, surgical consent, monitors and equipment checked, pre-op evaluation and timeout performed Spinal Block Patient position: sitting Prep: ChloraPrep Patient monitoring: heart rate, continuous pulse ox and blood pressure Approach: midline Location: L3-4 Injection technique: single-shot Needle Needle type: Introducer and Pencil-Tip  Needle gauge: 24 G Needle length: 9 cm Assessment Sensory level: T4 Events: CSF return

## 2021-06-24 NOTE — Anesthesia Preprocedure Evaluation (Signed)
Anesthesia Evaluation  Patient identified by MRN, date of birth, ID band Patient awake    Reviewed: Allergy & Precautions, NPO status , Patient's Chart, lab work & pertinent test results  History of Anesthesia Complications Negative for: history of anesthetic complications  Airway Mallampati: II  TM Distance: >3 FB Neck ROM: Full    Dental no notable dental hx.    Pulmonary neg pulmonary ROS, neg sleep apnea, neg COPD,    breath sounds clear to auscultation- rhonchi (-) wheezing      Cardiovascular hypertension, Pt. on medications (-) CAD, (-) Past MI, (-) Cardiac Stents and (-) CABG  Rhythm:Regular Rate:Normal - Systolic murmurs and - Diastolic murmurs    Neuro/Psych  Headaches, neg Seizures negative psych ROS   GI/Hepatic Neg liver ROS, GERD  ,  Endo/Other  negative endocrine ROSneg diabetes  Renal/GU negative Renal ROS     Musculoskeletal  (+) Arthritis ,   Abdominal (+) - obese,   Peds  Hematology negative hematology ROS (+)   Anesthesia Other Findings Past Medical History: No date: Arthritis No date: GERD (gastroesophageal reflux disease) No date: Headache     Comment:  EVERY DAY HEADACHES No date: Hepatitis No date: Hyperlipidemia No date: Hypertension No date: Insomnia 04/2017: Osteopenia     Comment:  T score -2.1 FRAX 4.6%/0.6%   Reproductive/Obstetrics                             Lab Results  Component Value Date   WBC 8.1 06/16/2021   HGB 14.6 06/24/2021   HCT 43.0 06/24/2021   MCV 89.1 06/16/2021   PLT 226 06/16/2021    Anesthesia Physical Anesthesia Plan  ASA: 2  Anesthesia Plan: Spinal   Post-op Pain Management:    Induction:   PONV Risk Score and Plan: 2 and Propofol infusion  Airway Management Planned: Natural Airway  Additional Equipment:   Intra-op Plan:   Post-operative Plan:   Informed Consent: I have reviewed the patients History and  Physical, chart, labs and discussed the procedure including the risks, benefits and alternatives for the proposed anesthesia with the patient or authorized representative who has indicated his/her understanding and acceptance.     Dental advisory given  Plan Discussed with: CRNA and Anesthesiologist  Anesthesia Plan Comments:         Anesthesia Quick Evaluation

## 2021-06-24 NOTE — Evaluation (Signed)
Physical Therapy Evaluation Patient Details Name: Katrina Simpson MRN: [id number] DOB: 1956-02-27 Today's Date: 06/24/2021   History of Present Illness  65 y/o female s/p R TKA 6/28.  Clinical Impression  Interaction with nursing since surgery, pt starting to get feeling down leg and show some quad control; plan to go home today.  Pt with no pain at time of eval but able to engage R LE muscles actively, still having some buckling/etc w/o deliberate active engagement during [place].  Pt did quickly show good ability to actively arrest buckling and walked ~125 ft and was also able to negotiate up/down steps w/o direct assist. Educated on HEP, home management, etc, husband present; all questions answered.    Follow Up Recommendations Follow surgeon's recommendation for DC plan and follow-up therapies    Equipment Recommendations  None recommended by PT    Recommendations for Other Services       Precautions / Restrictions Precautions Precautions: Knee;Fall Restrictions Weight Bearing Restrictions: Yes RLE Weight Bearing: Weight bearing as tolerated      Mobility  Bed Mobility               General bed mobility comments: in recliner on arrival    Transfers Overall transfer level: Modified independent Equipment used: Rolling walker (2 wheeled)             General transfer comment: Pt needed reminders for appropriate use of UEs, able to rise w/o physical assist  Ambulation/Gait Ambulation/Gait assistance: Supervision Gait Distance (Feet): 125 Feet Assistive device: Rolling walker (2 wheeled)       General Gait Details: Pt with some initial hesitancy with WBing/lack of R knee confidence but was able to deliberately engage quad and insure no buckling  Stairs Stairs: Yes Stairs assistance: Supervision Stair Management: Two rails;Forwards Number of Stairs: 4 General stair comments: Pt able to negotiate up/down steps w/o direct assist, cuing to insure appropriate  sequencing  Wheelchair Mobility    Modified Rankin (Stroke Patients Only)       Balance Overall balance assessment: Modified Independent                                           Pertinent Vitals/Pain Pain Assessment: No/denies pain Pain Score: 0-No pain    Home Living Family/patient expects to be discharged to:: Private residence Living Arrangements: Spouse/significant other Available Help at Discharge: Family;Available 24 hours/day   Home Access: Stairs to enter Entrance Stairs-Rails: Can reach both Entrance Stairs-Number of Steps: 3 Home Layout: One level Home Equipment: Walker - 2 wheels;Bedside commode      Prior Function Level of Independence: Independent               Hand Dominance        Extremity/Trunk Assessment   Upper Extremity Assessment Upper Extremity Assessment: Overall WFL for tasks assessed    Lower Extremity Assessment Lower Extremity Assessment: Overall WFL for tasks assessed (expected post-op weakness)       Communication   Communication: No difficulties  Cognition Arousal/Alertness: Awake/alert Behavior During Therapy: WFL for tasks assessed/performed Overall Cognitive Status: Within Functional Limits for tasks assessed                                        General Comments  Exercises Total Joint Exercises Ankle Circles/Pumps: AROM;10 reps Quad Sets: Strengthening;10 reps Short Arc Quad: AROM;10 reps Hip ABduction/ADduction: Strengthening;10 reps Straight Leg Raises: AROM;Strengthening;10 reps Long Arc Quad: Strengthening;10 reps Knee Flexion: PROM;5 reps Goniometric ROM: 0-101   Assessment/Plan    PT Assessment Patient needs continued PT services  PT Problem List Decreased strength;Decreased range of motion;Decreased activity tolerance;Decreased balance;Decreased mobility;Decreased knowledge of use of DME;Decreased safety awareness       PT Treatment Interventions DME  instruction;Stair training;Gait training;Functional mobility training;Therapeutic activities;Therapeutic exercise;Balance training;Neuromuscular re-education;Patient/family education    PT Goals (Current goals can be found in the Care Plan section)  Acute Rehab PT Goals Patient Stated Goal: go home PT Goal Formulation: With patient Time For Goal Achievement: 07/08/21 Potential to Achieve Goals: Good    Frequency BID   Barriers to discharge        Co-evaluation               AM-PAC PT "6 Clicks" Mobility  Outcome Measure Help needed turning from your back to your side while in a flat bed without using bedrails?: None Help needed moving from lying on your back to sitting on the side of a flat bed without using bedrails?: None Help needed moving to and from a bed to a chair (including a wheelchair)?: None Help needed standing up from a chair using your arms (e.g., wheelchair or bedside chair)?: None Help needed to walk in hospital room?: None Help needed climbing 3-5 steps with a railing? : None 6 Click Score: 24    End of Session Equipment Utilized During Treatment: Gait belt Activity Tolerance: Patient tolerated treatment well Patient left: in chair;with nursing/sitter in room;with family/visitor present Nurse Communication: Mobility status PT Visit Diagnosis: Muscle weakness (generalized) (M62.81);Difficulty in walking, not elsewhere classified (R26.2)    Time: 1720-1750 PT Time Calculation (min) (ACUTE ONLY): 30 min   Charges:   PT Evaluation $PT Eval Low Complexity: 1 Low PT Treatments $Gait Training: 8-22 mins $Therapeutic Exercise: 8-22 mins        Kreg Shropshire, DPT 06/24/2021, 6:09 PM

## 2021-06-24 NOTE — Transfer of Care (Signed)
Immediate Anesthesia Transfer of Care Note  Patient: Katrina Simpson  Procedure(s) Performed: TOTAL KNEE ARTHROPLASTY (Right: Knee)  Patient Location: PACU  Anesthesia Type:General  Level of Consciousness: sedated  Airway & Oxygen Therapy: Patient Spontanous Breathing and Patient connected to face mask oxygen  Post-op Assessment: Report given to RN and Post -op Vital signs reviewed and stable  Post vital signs: Reviewed and stable  Last Vitals:  Vitals Value Taken Time  BP 108/64 06/24/21 1254  Temp 36.1 C 06/24/21 1254  Pulse 72 06/24/21 1256  Resp 13 06/24/21 1256  SpO2 99 % 06/24/21 1256  Vitals shown include unvalidated device data.  Last Pain:  Vitals:   06/24/21 1254  TempSrc:   PainSc: Asleep         Complications: No notable events documented.

## 2021-06-24 NOTE — H&P (Signed)
History of Present Illness: Katrina Simpson is a 65 y.o.female who is being seen in consultation at the request of Dr. Candelaria Stagers for right knee pain. The symptoms began several months ago. She recalls falling onto her knee in January, but denies the onset of any symptoms until February. She saw Dr. Candelaria Stagers who recommended activity modification, home exercises, and over-the-counter medications. She followed up several weeks later with continued symptoms, so he performed a steroid injection which also provided little if any relief of her symptoms. Therefore, the patient was sent for an MRI scan and referred to me for further evaluation and treatment. She reports 4/10 pain on today's visit, but notes that her pain will get as high as 8/10 with any prolonged standing or ambulation. The pain is located along the medial aspect of the knee. The pain is described as aching, stabbing and throbbing. The symptoms are aggravated with normal daily activities, using stairs, at higher levels of activity, rising from a chair, walking, standing and standing pivot. She also describes a recent producible crepitus behind her kneecap which she states has been present for several years and is not too uncomfortable. She has no associated swelling and no deformity. She has tried over-the-counter medications, anti-inflammatories and steroid injections with limited benefit.  Current Outpatient Medications:  chlorthalidone 25 MG tablet Take by mouth once daily.   cholecalciferol (VITAMIN D3) 1000 unit tablet Take by mouth   diclofenac (VOLTAREN) 75 MG EC tablet Take 1 tablet (75 mg total) by mouth 2 (two) times daily with meals for 30 days 60 tablet 0   nortriptyline (PAMELOR) 10 MG capsule Take 40 mg by mouth nightly   polyethylene glycol (MIRALAX) powder Take as directed for colonoscopy prep. 255 g 0   rosuvastatin (CRESTOR) 5 MG tablet Take by mouth   Allergies:   Nitrofurantoin Monohyd/M-Cryst Itching and Swelling    Penicillins Swelling   Sulfa (Sulfonamide Antibiotics) Itching   Past Medical History:   Essential hypertension, benign 06/15/2013 (Diagnosed May 2014)   Hyperlipidemia 02/25/2016   Past Surgical History:   CESAREAN SECTION   COLONOSCOPY 05/03/2015 (Sess[place] Serrated Adenoma: [facility] 04/2018)   COLONOSCOPY 05/19/2018 ([place] Adenomatous Polyps: [facility] 04/2023)   LAPAROSCOPIC TUBAL LIGATION   History reviewed. No pertinent family history.   Social History:   Socioeconomic History:   Marital status: Married  Tobacco Use   Smoking status: Never Smoker   Smokeless tobacco: Never Used  Substance and Sexual Activity   Alcohol use: Never   Drug use: Never   Sexual activity: Never   Review of Systems:  A comprehensive 14 point ROS was performed, reviewed, and the pertinent orthopaedic findings are documented in the HPI.  Physical Exam: Vitals:  05/09/21 1327  BP: (!) 130/90  Weight: 78.8 kg (173 lb 12.8 oz)  Height: 162.6 cm (5\' 4" )  PainSc: 4  PainLoc: Knee   General/Constitutional: The patient appears to be well-nourished, well-developed, and in no acute distress. Neuro/Psych: Normal mood and affect, oriented to person, place and time. Eyes: Non-icteric. Pupils are equal, round, and reactive to light, and exhibit synchronous movement. Lymphatic: No palpable adenopathy. Respiratory: Lungs clear to auscultation, Normal chest excursion, No wheezes and Non-labored breathing Cardiovascular: Regular rate and rhythm. No murmurs. and No edema, swelling or tenderness, except as noted in detailed exam. Vascular: No edema, swelling or tenderness, except as noted in detailed exam. Integumentary: No impressive skin lesions present, except as noted in detailed exam. Musculoskeletal: Unremarkable, except as noted in detailed exam.  Right knee exam: GAIT: mild limp and uses no assistive devices. ALIGNMENT: normal SKIN: unremarkable SWELLING: mild EFFUSION: trace WARMTH: no warmth TENDERNESS: mild  over the medial joint line ROM: 0 to 130 degrees without pain McMURRAY'S: equivocal PATELLOFEMORAL: normal tracking with no peri-patellar tenderness and negative apprehension sign CREPITUS: Mild patellofemoral crepitance LACHMAN'S: negative PIVOT SHIFT: negative ANTERIOR DRAWER: negative POSTERIOR DRAWER: negative VARUS/VALGUS: stable  She is neurovascularly intact to the right lower extremity and foot.  Knee Imaging, external: Right knee: A recent MRI scan of the right knee is available for review and has been reviewed by myself. By report, the scan demonstrates evidence of a radial tear involving the posterior medial root with medial extrusion of the meniscus. In addition, there is evidence of moderate degenerative changes of the medial compartment as well as a "subcortical stress fracture versus not fragmented osteochondral lesion in the medial tibial plateau ... with surrounding marrow edema." Moderate-severe degenerative changes of the patellofemoral compartment also are noted. The lateral compartment appears to be well-maintained. No ligamentous pathology is noted. Both the films and report were reviewed by myself and discussed with the patient.  Assessment:  1. Primary osteoarthritis of right knee. 2. Complex tear of medial meniscus of right knee.   Plan: The treatment options were discussed with the patient. In addition, patient educational materials were provided regarding the diagnosis and treatment options. The patient is quite frustrated by her symptoms and functional limitations, and is ready to consider more aggressive treatment options. Therefore, I have recommended a surgical procedure. We discussed the potential benefits and drawbacks of an arthroscopic root repair with debridement and subchondroplasty of the medial tibial plateau versus a partial knee replacement versus a total knee replacement. After this lengthy discussion, the patient has decided she would like to proceed  with a right total knee arthroplasty. The procedure was discussed with the patient, as were the potential risks (including bleeding, infection, nerve and/or blood vessel injury, persistent or recurrent pain, loosening and/or failure of the components, dislocation, need for further surgery, blood clots, strokes, heart attacks and/or arhythmias, pneumonia, etc.) and benefits. The patient states his/her understanding and wishes to proceed. All of the patient's questions and concerns were answered. She can call any time with further concerns. She will return to work without restrictions. She will follow up post-surgery, routine.   H&P reviewed and patient re-examined. No changes.

## 2021-06-24 NOTE — OR Nursing (Signed)
Can lift her buttock with the quad of the left leg.  Can pronate the foot with pressure but unable to flex foot significantly.  Can wiggle her toes and bend both knees.  Bladder scan reveals 322 cc feels no urge to urinate at this time.

## 2021-06-24 NOTE — Telephone Encounter (Signed)
Medication Refill - Medication:  chlorthalidone (HYGROTON) 25 MG tablet  rosuvastatin (CRESTOR) 5 MG tablet   Has the patient contacted their pharmacy? No.  Preferred Pharmacy (with phone number or street name): [id number] - [place], [place] [address]  Phone:  [phone] Fax:  [phone]  Agent: Please be advised that RX refills may take up to 3 business days. We ask that you follow-up with your pharmacy.

## 2021-06-24 NOTE — Discharge Instructions (Addendum)
Orthopedic discharge instructions: May shower with intact OpSite dressing. Apply ice frequently to knee or use Polar Care.  Use continuously for first 72 hours.  Then for 1 hour 4 times a day until PT. Start Eliquis 2.5 mg twice daily for 2 weeks on Wednesday, 6/29, then take aspirin 325 mg daily for 4 weeks. Take oxycodone as prescribed when needed.  May supplement with ES Tylenol if necessary. May weight-bear as tolerated on right leg - use walker for balance and support. Start home physical therapy in the next 1 to 2 days as arranged. Follow-up in 10-14 days or as scheduled.    AMBULATORY SURGERY  DISCHARGE INSTRUCTIONS   The drugs that you were given will stay in your system until tomorrow so for the next 24 hours you should not:  Drive an automobile Make any legal decisions Drink any alcoholic beverage   You may resume regular meals tomorrow.  Today it is better to start with liquids and gradually work up to solid foods.  You may eat anything you prefer, but it is better to start with liquids, then soup and crackers, and gradually work up to solid foods.   Please notify your doctor immediately if you have any unusual bleeding, trouble breathing, redness and pain at the surgery site, drainage, fever, or pain not relieved by medication.    Additional Instructions:  Leave Green arm band on for 4 days  Please contact your physician with any problems or Same Day Surgery at [phone], Monday through Friday 6 am to 4 pm, or [place] at [facility] number at [phone].

## 2021-06-24 NOTE — Op Note (Signed)
06/24/2021  12:51 PM  Patient:   Katrina Simpson  Pre-Op Diagnosis:   Degenerative joint disease, right knee.  Post-Op Diagnosis:   Same  Procedure:   Right TKA using all-cemented [employer] Vanguard system with a 62.5 mm PCR femur, a 6712 mm anterior stabilized mm tibial tray with a 12 mm anterior stabilized E-poly insert, and a 31 x 6.2 mm all-poly 3-pegged domed patella.  Surgeon:   Pascal Lux, MD  Assistant:   Cameron Proud, PA-C; Rennis Chris, PA-S  Anesthesia:   Spinal  Findings:   As above  Complications:   None  EBL:   10 cc  Fluids:   600 cc crystalloid  UOP:   None  TT:   85 minutes at 300 mmHg  Drains:   None  Closure:   Staples  Implants:   As above  Brief Clinical Note:   The patient is a 65 year old female with a history of progressively worsening medial-sided right knee pain. The patient's symptoms have progressed despite medications, activity modification, injections, etc. The patient's history and examination were consistent with a displaced posterior root tear of the medial meniscus with underlying degenerative joint disease of her right knee confirmed by plain radiographs and MRI scan. The patient presents at this time for a right total knee arthroplasty.  Procedure:   The patient was brought into the operating room. After adequate spinal anesthesia was obtained, the patient was lain in the supine position before the right lower extremity was prepped with ChloraPrep solution and draped sterilely. Preoperative antibiotics were administered. After verifying the proper laterality with a surgical timeout, the limb was exsanguinated with an Esmarch and the tourniquet inflated to 300 mmHg.   A standard anterior approach to the knee was made through an approximately 7 inch incision. The incision was carried down through the subcutaneous tissues to expose superficial retinaculum. This was split the length of the incision and the medial flap elevated sufficiently to  expose the medial retinaculum. The medial retinaculum was incised, leaving a 3-4 mm cuff of tissue on the patella. This was extended distally along the medial border of the patellar tendon and proximally through the medial third of the quadriceps tendon. A subtotal fat pad excision was performed before the soft tissues were elevated off the anteromedial and anterolateral aspects of the proximal tibia to the level of the collateral ligaments. The anterior portions of the medial and lateral menisci were removed, as was the anterior cruciate ligament. With the knee flexed to 90, the external tibial guide was positioned and the appropriate proximal tibial cut made. This piece was taken to the back table where it was measured and found to be optimally replicated by a 67 mm component.  Attention was directed to the distal femur. The intramedullary canal was accessed through a 3/8" drill hole. The intramedullary guide was inserted and positioned in order to obtain a neutral flexion gap. The intercondylar block was positioned with care taken to avoid notching the anterior cortex of the femur. The appropriate cut was made. Next, the distal cutting block was placed at 5 of valgus alignment. Using the 9 mm slot, the distal cut was made. The distal femur was measured and found to be optimally replicated by the 76.7 mm component. The 62.5 mm 4-in-1 cutting block was positioned and first the posterior, then the posterior chamfer, the anterior chamfer, and finally the anterior cuts were made. At this point, the posterior portions medial and lateral menisci were removed.  A trial reduction was performed using the appropriate femoral and tibial components with first the 10 mm and then the 12 mm insert. This demonstrated excellent stability to varus and valgus stressing both in flexion and extension while permitting full extension. Patella tracking was assessed and found to be excellent. Therefore, the tibial guide position  was marked on the proximal tibia. The patella thickness was measured and found to be 19 mm. Therefore, the appropriate cut was made. The patellar surface was measured and found to be optimally replicated by the 31 mm component. The three peg holes were drilled in place before the trial button was inserted. Patella tracking was assessed and found to be excellent, passing the "no thumb test". The lug holes were drilled into the distal femur before the trial component was removed, leaving only the tibial tray. The keel was then created using the appropriate tower, reamer, and punch.  The bony surfaces were prepared for cementing by irrigating them thoroughly with sterile saline solution via the jet lavage system. A bone plug was fashioned from some of the bone that had been removed previously and used to plug the distal femoral canal. In addition, 20 cc of Exparel diluted out to 60 cc with normal saline and 30 cc of 0.5% Sensorcaine were injected into the postero-medial and postero-lateral aspects of the knee, the medial and lateral gutter regions, and the peri-incisional tissues to help with postoperative analgesia. Meanwhile, the cement was being mixed on the back table. When it was ready, the tibial tray was cemented in first. The excess cement was removed using [employer] fast streamer. Next, the femoral component was impacted into place. Again, the excess cement was removed using [employer] fast streamer. The 12 mm trial insert was positioned and the knee brought into extension while the cement hardened. Finally, the patella was cemented into place and secured using the patellar clamp. Again, the excess cement was removed using [employer] fast streamer. Once the cement had hardened, the knee was placed through a range of motion with the findings as described above. Therefore, the trial insert was removed and, after verifying that no cement had been retained posteriorly, the permanent 12 mm anterior stabilized E-polyethylene insert was  positioned and secured using the appropriate key locking mechanism. Again the knee was placed through a range of motion with the findings as described above.  The wound was copiously irrigated with sterile saline solution using the jet lavage system before the quadriceps tendon and retinacular layer were reapproximated using #0 Vicryl interrupted sutures. The superficial retinacular layer also was closed using a running #0 Vicryl suture. A total of 10 cc of transexemic acid (TXA) was injected intra-articularly before the subcutaneous tissues were closed in several layers using 2-0 Vicryl interrupted sutures. The skin was closed using staples. A sterile honeycomb dressing was applied to the skin before the leg was wrapped with an Ace wrap to accommodate the Polar Care device. The patient was then awakened and returned to the recovery room in satisfactory condition after tolerating the procedure well.

## 2021-06-25 ENCOUNTER — Encounter: Payer: Self-pay | Admitting: Surgery

## 2021-06-25 NOTE — Anesthesia Postprocedure Evaluation (Signed)
Anesthesia Post Note  Patient: Katrina Simpson  Procedure(s) Performed: TOTAL KNEE ARTHROPLASTY (Right: Knee)  Patient location during evaluation: PACU Anesthesia Type: Spinal Level of consciousness: oriented and awake and alert Pain management: pain level controlled Vital Signs Assessment: post-procedure vital signs reviewed and stable Respiratory status: spontaneous breathing, respiratory function stable and nonlabored ventilation Cardiovascular status: blood pressure returned to baseline and stable Postop Assessment: no headache, no backache and spinal receding Anesthetic complications: no   No notable events documented.   Last Vitals:  Vitals:   06/24/21 1616 06/24/21 1719  BP: 100/62 (P) 109/68  Pulse: (!) 59 (!) (P) 58  Resp: 16   Temp:  (!) (P) 36.3 C  SpO2: 98%     Last Pain:  Vitals:   06/24/21 1719  TempSrc: (P) Temporal  PainSc:                  Tallin Hart

## 2021-08-22 ENCOUNTER — Other Ambulatory Visit: Payer: Self-pay | Admitting: Family Medicine

## 2021-08-22 DIAGNOSIS — G4700 Insomnia, unspecified: Secondary | ICD-10-CM

## 2021-08-25 ENCOUNTER — Encounter: Payer: Self-pay | Admitting: Family Medicine

## 2021-08-26 MED ORDER — DICLOFENAC SODIUM 75 MG PO TBEC: 75.0000 mg | DELAYED_RELEASE_TABLET | Freq: Two times a day (BID) | ORAL | 0 refills | Status: DC

## 2021-09-14 ENCOUNTER — Other Ambulatory Visit: Payer: Self-pay | Admitting: Family Medicine

## 2021-09-24 ENCOUNTER — Other Ambulatory Visit: Payer: Self-pay

## 2021-09-24 ENCOUNTER — Ambulatory Visit (INDEPENDENT_AMBULATORY_CARE_PROVIDER_SITE_OTHER): Payer: No Typology Code available for payment source

## 2021-09-24 DIAGNOSIS — Z23 Encounter for immunization: Secondary | ICD-10-CM | POA: Diagnosis not present

## 2021-09-29 ENCOUNTER — Encounter: Payer: Self-pay | Admitting: Family Medicine

## 2021-10-06 ENCOUNTER — Other Ambulatory Visit: Payer: Self-pay | Admitting: Family Medicine

## 2021-10-06 NOTE — Telephone Encounter (Signed)
Requested Prescriptions  Pending Prescriptions Disp Refills  . diclofenac (VOLTAREN) 75 MG EC tablet [Pharmacy Med Name: diclofenac sodium 75 mg tablet,delayed release (VOLTAREN)] 30 tablet 0    Sig: Take 1 tablet (75 mg total) by mouth 2 times daily.     Analgesics:  NSAIDS Failed - 10/06/2021  9:05 AM      Failed - Cr in normal range and within 360 days    Creatinine, Ser  Date Value Ref Range Status  06/24/2021 1.10 (H) 0.44 - 1.00 mg/dL Final         Passed - HGB in normal range and within 360 days    Hemoglobin  Date Value Ref Range Status  06/24/2021 14.6 12.0 - 15.0 g/dL Final         Passed - Patient is not pregnant      Passed - Valid encounter within last 12 months    Recent Outpatient Visits          6 months ago Drug rash   [facility] [place], Dionne Bucy, MD   1 year ago Encounter for annual physical exam   [facility] [place], Dionne Bucy, MD   1 year ago Essential hypertension, benign   [facility] [place], Dionne Bucy, MD   2 years ago Essential hypertension, benign   [facility] [place], Dionne Bucy, MD   2 years ago Essential hypertension, benign   [place], Herbie [place], [place]      Future Appointments            In 2 weeks Bacigalupo, Dionne Bucy, MD [facility], [place]

## 2021-10-23 ENCOUNTER — Other Ambulatory Visit: Payer: Self-pay

## 2021-10-23 ENCOUNTER — Encounter: Payer: Self-pay | Admitting: Family Medicine

## 2021-10-23 ENCOUNTER — Ambulatory Visit (INDEPENDENT_AMBULATORY_CARE_PROVIDER_SITE_OTHER): Payer: No Typology Code available for payment source | Admitting: Family Medicine

## 2021-10-23 VITALS — BP 108/78 | HR 75 | Temp 97.6°F | Resp 16 | Ht 64.0 in | Wt 163.1 lb

## 2021-10-23 DIAGNOSIS — G4700 Insomnia, unspecified: Secondary | ICD-10-CM

## 2021-10-23 DIAGNOSIS — E663 Overweight: Secondary | ICD-10-CM

## 2021-10-23 DIAGNOSIS — E782 Mixed hyperlipidemia: Secondary | ICD-10-CM | POA: Diagnosis not present

## 2021-10-23 DIAGNOSIS — Z0000 Encounter for general adult medical examination without abnormal findings: Secondary | ICD-10-CM | POA: Diagnosis not present

## 2021-10-23 DIAGNOSIS — I10 Essential (primary) hypertension: Secondary | ICD-10-CM | POA: Diagnosis not present

## 2021-10-23 DIAGNOSIS — G44229 Chronic tension-type headache, not intractable: Secondary | ICD-10-CM

## 2021-10-23 NOTE — Assessment & Plan Note (Signed)
Discussed importance of healthy weight management Discussed diet and exercise  

## 2021-10-23 NOTE — Assessment & Plan Note (Signed)
Previously well controlled Continue statin Repeat FLP and CMP  

## 2021-10-23 NOTE — Assessment & Plan Note (Signed)
Well controlled Continue current medications Recheck metabolic panel F/u in 6 months  

## 2021-10-23 NOTE — Progress Notes (Signed)
Complete physical exam   Patient: Katrina Simpson   DOB: 1956/04/06   65 y.o. Female  MRN: [id number] Visit Date: 10/23/2021  Today's healthcare provider: Lavon Paganini, MD   Chief Complaint  Patient presents with   Annual Exam   Subjective    Katrina Simpson is a 65 y.o. female who presents today for a complete physical exam.  She reports consuming a general diet. The patient does not participate in regular exercise at present. She generally feels well. She reports sleeping well. She does not have additional problems to discuss today.  HPI  Hypertension, follow-up  BP Readings from Last 3 Encounters:  10/23/21 108/78  06/24/21 (P) 109/68  06/16/21 127/85   Wt Readings from Last 3 Encounters:  10/23/21 163 lb 1.6 oz (74 kg)  06/24/21 169 lb (76.7 kg)  06/16/21 171 lb 4.8 oz (77.7 kg)     She was last seen for hypertension 7 months ago.  BP at that visit was 132/84. Management since that visit includes continue current medication.  She reports excellent compliance with treatment. She is not having side effects.  She is following a Regular diet. She is not exercising. She does not smoke.  Use of agents associated with hypertension: none.   Outside blood pressures are not being checked. Symptoms: No chest pain No chest pressure  No palpitations No syncope  No dyspnea No orthopnea  No paroxysmal nocturnal dyspnea No lower extremity edema   Pertinent labs: Lab Results  Component Value Date   CHOL 221 (H) 03/18/2021   HDL 53 03/18/2021   LDLCALC 144 (H) 03/18/2021   TRIG 136 03/18/2021   CHOLHDL 4.2 03/18/2021   Lab Results  Component Value Date   NA 140 06/24/2021   K 4.1 06/24/2021   CREATININE 1.10 (H) 06/24/2021   GFRNONAA >60 06/16/2021   GLUCOSE 79 06/24/2021     The 10-year ASCVD risk score (Arnett DK, et al., 2019) is: 6.3%   ---------------------------------------------------------------------------------------------------   Lipid/Cholesterol, Follow-up  Last lipid panel Other pertinent labs  Lab Results  Component Value Date   CHOL 221 (H) 03/18/2021   HDL 53 03/18/2021   LDLCALC 144 (H) 03/18/2021   TRIG 136 03/18/2021   CHOLHDL 4.2 03/18/2021   Lab Results  Component Value Date   ALT 57 (H) 06/16/2021   AST 40 06/16/2021   PLT 226 06/16/2021     She was last seen for this 7 months ago.  Management since that visit includes continue statin.  She reports excellent compliance with treatment. She is not having side effects.   Symptoms: No chest pain No chest pressure/discomfort  No dyspnea No lower extremity edema  No numbness or tingling of extremity No orthopnea  No palpitations No paroxysmal nocturnal dyspnea  No speech difficulty No syncope   Current diet: in general, a "healthy" diet   Current exercise: no regular exercise  The 10-year ASCVD risk score (Arnett DK, et al., 2019) is: 6.3%  ---------------------------------------------------------------------------------------------------   ---------------------------------------------------------------------------------------------------  Past Medical History:  Diagnosis Date   Arthritis    GERD (gastroesophageal reflux disease)    Headache    EVERY DAY HEADACHES   Hepatitis    Hyperlipidemia    Hypertension    Insomnia    Osteopenia 04/2017   T score -2.1 FRAX 4.6%/0.6%   Past Surgical History:  Procedure Laterality Date   CESAREAN SECTION     X3   COLONOSCOPY N/A 05/03/2015   Procedure: COLONOSCOPY;  Surgeon: Manya Silvas, MD;  [facility] END[facility]COPY;  Service: Endoscopy;  Laterality: N/A;   TOTAL KNEE ARTHROPLASTY Right 06/24/2021   Procedure: TOTAL KNEE ARTHROPLASTY;  Surgeon: Corky Mull, MD;  Location: [facility] ORS;  Service: Orthopedics;  Laterality: Right;   TUBAL LIGATION     Social History   Socioeconomic History   Marital status: Married    Spouse name: Not on file   Number of children: Not on file    Years of education: Not on file   Highest education level: Not on file  Occupational History   Not on file  Tobacco Use   Smoking status: Never   Smokeless tobacco: Never  Vaping Use   Vaping Use: Never used  Substance and Sexual Activity   Alcohol use: No   Drug use: No   Sexual activity: Yes    Birth control/protection: Post-menopausal  Other Topics Concern   Not on file  Social History Narrative   Not on file   Social Determinants of Health   Financial Resource Strain: Not on file  Food Insecurity: Not on file  Transportation Needs: Not on file  Physical Activity: Not on file  Stress: Not on file  Social Connections: Not on file  Intimate Partner Violence: Not on file   Family Status  Relation Name Status   Father  Deceased   Mother  (Not Specified)   Sister  Alive   Brother  Alive   Neg Hx  (Not Specified)   Family History  Problem Relation Age of Onset   Stroke Father 40   Hypertension Mother 25   Healthy Brother    Colon cancer Neg Hx    Breast cancer Neg Hx    Allergies  Allergen Reactions   Macrobid [Nitrofurantoin Macrocrystal]     Don't remember    Penicillins Swelling    FINGERS ONLY-AGE 40   Sulfa Antibiotics Itching   Tylenol [Acetaminophen] Rash    Patient Care Team: Virginia Crews, MD as PCP - General (Family Medicine)   Medications: Outpatient Medications Prior to Visit  Medication Sig   chlorthalidone (HYGROTON) 25 MG tablet Take 1 tablet (25 mg total) by mouth daily.   cholecalciferol (VITAMIN D3) 25 MCG (1000 UNIT) tablet Take 1,000 Units by mouth daily.   diclofenac (VOLTAREN) 75 MG EC tablet Take 1 tablet (75 mg total) by mouth 2 times daily.   nortriptyline (PAMELOR) 50 MG capsule Take 1 capsule (50 mg total) by mouth at bedtime.   omeprazole (PRILOSEC OTC) 20 MG tablet Take 20 mg by mouth daily as needed (acid reflux).   polyethylene glycol (MIRALAX / GLYCOLAX) 17 g packet Take 17 g by mouth daily.   rosuvastatin (CRESTOR)  5 MG tablet TAKE 1 TABLET (5 MG TOTAL) BY MOUTH DAILY. PLEASE CANCEL PRAVASTATIN.   [DISCONTINUED] oxyCODONE (ROXICODONE) 5 MG immediate release tablet Take 1-2 tablets (5-10 mg total) by mouth every 4 (four) hours as needed for moderate pain or severe pain.   [DISCONTINUED] apixaban (ELIQUIS) 2.5 MG TABS tablet Take 1 tablet (2.5 mg total) by mouth 2 (two) times daily. (Patient not taking: Reported on 10/23/2021)   No facility-administered medications prior to visit.    Review of Systems  All other systems reviewed and are negative.  Last CBC Lab Results  Component Value Date   WBC 8.1 06/16/2021   HGB 14.6 06/24/2021   HCT 43.0 06/24/2021   MCV 89.1 06/16/2021   MCH 31.5 06/16/2021   RDW 12.5 06/16/2021  PLT 226 85/88/5027   Last metabolic panel Lab Results  Component Value Date   GLUCOSE 79 06/24/2021   NA 140 06/24/2021   K 4.1 06/24/2021   CL 100 06/24/2021   CO2 29 06/16/2021   BUN 13 06/24/2021   CREATININE 1.10 (H) 06/24/2021   GFRNONAA >60 06/16/2021   CALCIUM 9.6 06/16/2021   PROT 7.6 06/16/2021   ALBUMIN 4.4 06/16/2021   LABGLOB 3.1 03/18/2021   AGRATIO 1.5 03/18/2021   BILITOT 0.9 06/16/2021   ALKPHOS 75 06/16/2021   AST 40 06/16/2021   ALT 57 (H) 06/16/2021   ANIONGAP 10 06/16/2021   Last lipids Lab Results  Component Value Date   CHOL 221 (H) 03/18/2021   HDL 53 03/18/2021   LDLCALC 144 (H) 03/18/2021   TRIG 136 03/18/2021   CHOLHDL 4.2 03/18/2021       Objective    BP 108/78 (BP Location: Left Arm, Patient Position: Sitting, Cuff Size: Large)   Pulse 75   Temp 97.6 F (36.4 C) (Temporal)   Resp 16   Ht 5\' 4"  (1.626 m)   Wt 163 lb 1.6 oz (74 kg)   LMP 12/28/1998   SpO2 99%   BMI 28.00 kg/m  BP Readings from Last 3 Encounters:  10/23/21 108/78  06/24/21 (P) 109/68  06/16/21 127/85   Wt Readings from Last 3 Encounters:  10/23/21 163 lb 1.6 oz (74 kg)  06/24/21 169 lb (76.7 kg)  06/16/21 171 lb 4.8 oz (77.7 kg)      Physical  Exam Vitals reviewed.  Constitutional:      General: She is not in acute distress.    Appearance: Normal appearance. She is well-developed. She is not diaphoretic.  HENT:     Head: Normocephalic and atraumatic.     Right Ear: Tympanic membrane, ear canal and external ear normal.     Left Ear: Tympanic membrane, ear canal and external ear normal.     Nose: Nose normal.     Mouth/Throat:     Mouth: Mucous membranes are moist.     Pharynx: Oropharynx is clear. No oropharyngeal exudate.  Eyes:     General: No scleral icterus.    Conjunctiva/sclera: Conjunctivae normal.     Pupils: Pupils are equal, round, and reactive to light.  Neck:     Thyroid: No thyromegaly.  Cardiovascular:     Rate and Rhythm: Normal rate and regular rhythm.     Pulses: Normal pulses.     Heart sounds: Normal heart sounds. No murmur heard. Pulmonary:     Effort: Pulmonary effort is normal. No respiratory distress.     Breath sounds: Normal breath sounds. No wheezing or rales.  Abdominal:     General: There is no distension.     Palpations: Abdomen is soft.     Tenderness: There is no abdominal tenderness.  Musculoskeletal:        General: No deformity.     Cervical back: Neck supple.     Right lower leg: No edema.     Left lower leg: No edema.  Lymphadenopathy:     Cervical: No cervical adenopathy.  Skin:    General: Skin is warm and dry.     Findings: No rash.  Neurological:     Mental Status: She is alert and oriented to person, place, and time. Mental status is at baseline.     Sensory: No sensory deficit.     Motor: No weakness.     Gait: Gait  normal.  Psychiatric:        Mood and Affect: Mood normal.        Behavior: Behavior normal.        Thought Content: Thought content normal.     Last depression screening scores PHQ 2/9 Scores 10/23/2021 03/18/2021 03/18/2020  PHQ - 2 Score 0 0 0  PHQ- 9 Score 0 0 -   Last fall risk screening Fall Risk  10/23/2021  Falls in the past year? 1   Number falls in past yr: 0  Injury with Fall? 1  Risk for fall due to : History of fall(s)  Follow up Falls evaluation completed;Education provided;Falls prevention discussed   Last Audit-C alcohol use screening Alcohol Use Disorder Test (AUDIT) 10/23/2021  1. How often do you have a drink containing alcohol? 0  2. How many drinks containing alcohol do you have on a typical day when you are drinking? 0  3. How often do you have six or more drinks on one occasion? 0  AUDIT-C Score 0   A score of 3 or more in women, and 4 or more in men indicates increased risk for alcohol abuse, EXCEPT if all of the points are from question 1   No results found for any visits on 10/23/21.  Assessment & Plan    Routine Health Maintenance and Physical Exam  Exercise Activities and Dietary recommendations  Goals   None     Immunization History  Administered Date(s) Administered   Fluad Quad(high Dose 65+) 09/23/2020, 09/24/2021   Influenza,inj,Quad PF,6+ Mos 10/17/2016, 10/02/2017, 10/08/2018, 08/31/2019   Influenza-Unspecified 10/20/2013, 09/28/2015   PFIZER(Purple Top)SARS-COV-2 Vaccination 01/12/2020, 02/02/2020   Pneumococcal Conjugate-13 03/18/2020   Tdap 03/18/2020   Zoster Recombinat (Shingrix) 03/18/2020, 11/28/2020    Health Maintenance  Topic Date Due   COVID-19 Vaccine (3 - Booster for Pfizer series) 03/29/2020   Pneumonia Vaccine 64+ Years old (2 - PPSV23 if available, else PCV20) 03/18/2021   MAMMOGRAM  10/23/2022 (Originally 03/11/2020)   COLONOSCOPY (Pts 45-63yrs Insurance coverage will need to be confirmed)  05/02/2025   TETANUS/TDAP  03/18/2030   INFLUENZA VACCINE  Completed   DEXA SCAN  Completed   Hepatitis C Screening  Completed   Zoster Vaccines- Shingrix  Completed   HPV VACCINES  Aged Out    Discussed health benefits of physical activity, and encouraged her to engage in regular exercise appropriate for her age and condition.  Problem List Items Addressed This  Visit       Cardiovascular and Mediastinum   Essential hypertension, benign    Well controlled Continue current medications Recheck metabolic panel F/u in 6 months       Relevant Orders   Comprehensive metabolic panel     Other   Hyperlipidemia    Previously well controlled Continue statin Repeat FLP and CMP      Relevant Orders   Lipid panel   Comprehensive metabolic panel   Chronic headache    Well controlled Non-migranous Continue nortriptyline at current dose Discussed rebound headaches with voltaren - will taper dow nto lower dose      Overweight    Discussed importance of healthy weight management Discussed diet and exercise       Insomnia    Chronic and well controlled Continue nortriptyline      Other Visit Diagnoses     Encounter for annual physical exam    -  Primary   Relevant Orders   Lipid panel   Comprehensive metabolic panel  Check with pharmacy about pneumovax Upcoming mammogram  Return in about 6 months (around 04/23/2022) for chronic disease f/u.     I, Lavon Paganini, MD, have reviewed all documentation for this visit. The documentation on 10/23/21 for the exam, diagnosis, procedures, and orders are all accurate and complete.   Schyler Butikofer, Dionne Bucy, MD, MPH [facility]

## 2021-10-23 NOTE — Assessment & Plan Note (Signed)
Well controlled Non-migranous Continue nortriptyline at current dose Discussed rebound headaches with voltaren - will taper dow nto lower dose

## 2021-10-23 NOTE — Assessment & Plan Note (Signed)
Chronic and well controlled Continue nortriptyline

## 2021-10-23 NOTE — Patient Instructions (Signed)
Pneumovax 23 (ask at the pharmacy)

## 2021-11-06 ENCOUNTER — Telehealth: Payer: Self-pay

## 2021-11-06 ENCOUNTER — Other Ambulatory Visit: Payer: Self-pay | Admitting: Family Medicine

## 2021-11-06 DIAGNOSIS — I10 Essential (primary) hypertension: Secondary | ICD-10-CM

## 2021-11-06 NOTE — Telephone Encounter (Signed)
Copied from [facility] [id number]. Topic: General - Inquiry >> Nov 06, 2021  4:04 PM Oneta Rack wrote: Patient states she went online to request her medication and the preferred pharmacy listed [facility], patient states she has never used these pharmacy before and would like this removed from her chart. Assured the patient what I saw listed as her preferred pharmacy in her chart but wanted PCP to be aware and would like to know why.

## 2021-11-07 NOTE — Telephone Encounter (Signed)
[email or web address]

## 2021-11-07 NOTE — Telephone Encounter (Signed)
Requested Prescriptions  Pending Prescriptions Disp Refills  . chlorthalidone (HYGROTON) 25 MG tablet [Pharmacy Med Name: chlorthalidone 25 mg tablet (HYGROTON)] 90 tablet 1    Sig: Take 1 tablet (25 mg total) by mouth daily.     Cardiovascular: Diuretics - Thiazide Failed - 11/06/2021  4:12 PM      Failed - Cr in normal range and within 360 days    Creatinine, Ser  Date Value Ref Range Status  06/24/2021 1.10 (H) 0.44 - 1.00 mg/dL Final         Passed - Ca in normal range and within 360 days    Calcium  Date Value Ref Range Status  06/16/2021 9.6 8.9 - 10.3 mg/dL Final   Calcium, Ion  Date Value Ref Range Status  06/24/2021 1.19 1.15 - 1.40 mmol/L Final         Passed - K in normal range and within 360 days    Potassium  Date Value Ref Range Status  06/24/2021 4.1 3.5 - 5.1 mmol/L Final         Passed - Na in normal range and within 360 days    Sodium  Date Value Ref Range Status  06/24/2021 140 135 - 145 mmol/L Final  03/18/2021 139 134 - 144 mmol/L Final         Passed - Last BP in normal range    BP Readings from Last 1 Encounters:  10/23/21 108/78         Passed - Valid encounter within last 6 months    Recent Outpatient Visits          2 weeks ago Encounter for annual physical exam   [facility], Dionne Bucy, MD   7 months ago Drug rash   [facility] [place], Dionne Bucy, MD   1 year ago Encounter for annual physical exam   [facility] [place], Dionne Bucy, MD   1 year ago Essential hypertension, benign   [facility] [place], Dionne Bucy, MD   2 years ago Essential hypertension, benign   [place], Dionne Bucy, MD      Future Appointments            In 5 months Bacigalupo, Dionne Bucy, MD [facility]           . diclofenac (VOLTAREN) 75 MG EC tablet [Pharmacy Med Name: diclofenac sodium 75 mg tablet,delayed release (VOLTAREN)] 30 tablet 5     Sig: Take 1 tablet (75 mg total) by mouth 2 times daily.     Analgesics:  NSAIDS Failed - 11/06/2021  4:12 PM      Failed - Cr in normal range and within 360 days    Creatinine, Ser  Date Value Ref Range Status  06/24/2021 1.10 (H) 0.44 - 1.00 mg/dL Final         Passed - HGB in normal range and within 360 days    Hemoglobin  Date Value Ref Range Status  06/24/2021 14.6 12.0 - 15.0 g/dL Final         Passed - Patient is not pregnant      Passed - Valid encounter within last 12 months    Recent Outpatient Visits          2 weeks ago Encounter for annual physical exam   [facility], Dionne Bucy, MD   7 months ago Drug rash   [facility], Dionne Bucy, MD  1 year ago Encounter for annual physical exam   [facility] [place], Dionne Bucy, MD   1 year ago Essential hypertension, benign   [facility] [place], Dionne Bucy, MD   2 years ago Essential hypertension, benign   [facility] [place], Dionne Bucy, MD      Future Appointments            In 5 months Bacigalupo, Dionne Bucy, MD [facility], [place]

## 2021-12-02 ENCOUNTER — Other Ambulatory Visit: Payer: Self-pay | Admitting: Family Medicine

## 2021-12-02 DIAGNOSIS — G4700 Insomnia, unspecified: Secondary | ICD-10-CM

## 2021-12-02 NOTE — Telephone Encounter (Signed)
Requested Prescriptions  Pending Prescriptions Disp Refills  . nortriptyline (PAMELOR) 50 MG capsule [Pharmacy Med Name: nortriptyline 50 mg capsule (PAMELOR)] 90 capsule 1    Sig: Take 1 capsule (50 mg total) by mouth at bedtime.     Psychiatry:  Antidepressants - Heterocyclics (TCAs) Passed - 12/02/2021  9:05 AM      Passed - Valid encounter within last 6 months    Recent Outpatient Visits          1 month ago Encounter for annual physical exam   [facility] [place], Dionne Bucy, MD   8 months ago Drug rash   [facility] [place], Dionne Bucy, MD   1 year ago Encounter for annual physical exam   [facility], Dionne Bucy, MD   1 year ago Essential hypertension, benign   [facility] [place], Dionne Bucy, MD   2 years ago Essential hypertension, benign   [facility] [place], Dionne Bucy, MD      Future Appointments            In 4 months Bacigalupo, Dionne Bucy, MD [facility], [place]

## 2022-02-16 IMAGING — MR MR KNEE*R* W/O CM
7 series · 40 of 40 positions shown · non-contrast
Comparison: None.

CLINICAL DATA: Medial right knee pain for 5 weeks

EXAM:
MRI OF THE RIGHT KNEE WITHOUT CONTRAST
TECHNIQUE: Multiplanar, multisequence MR imaging of the knee was performed. No
intravenous contrast was administered.

[Series 8: T2 fat-sat · axial · right · 4.0mm · 0.50mm/px · z∈[-73,+51]mm · 6 of 26 slices shown (1 of 3)]
[im 1/26]
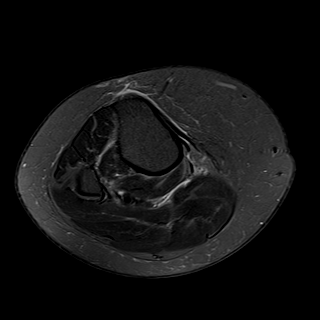
[im 6/26]
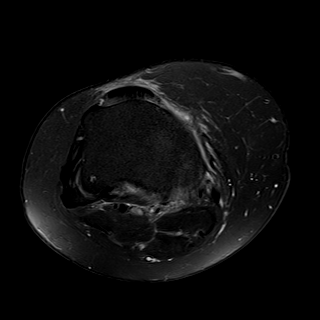
[im 11/26]
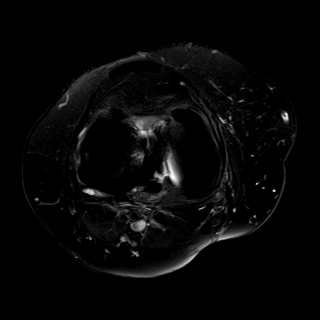
[im 16/26]
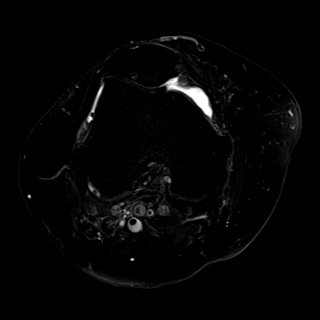
[im 21/26]
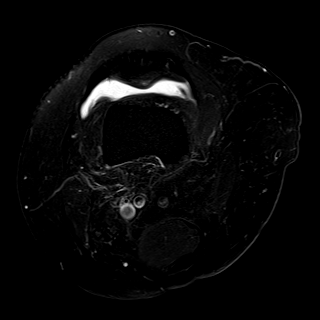
[im 26/26]
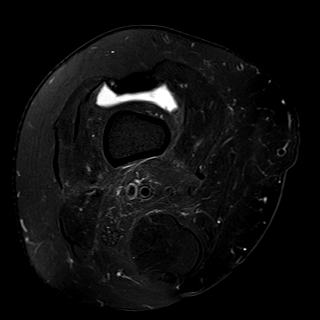

[Series 9: T2 fat-sat · coronal · right · 4.0mm · 0.59mm/px · 6 of 30 slices shown (2 of 3)]
[im 1/30]
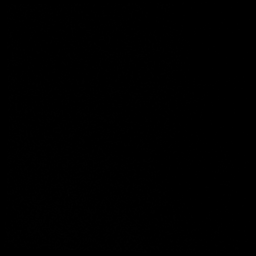
[im 6/30]
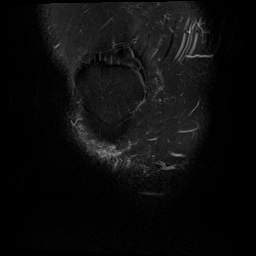
[im 12/30]
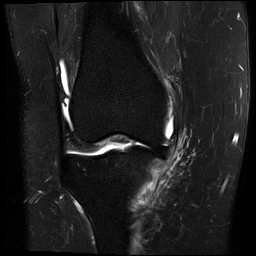
[im 18/30]
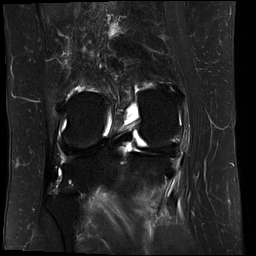
[im 24/30]
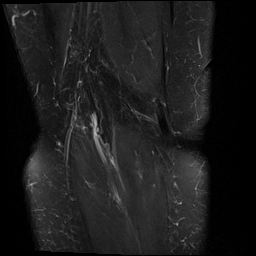
[im 30/30]
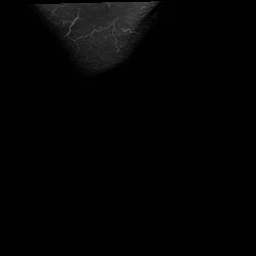

[Series 10: T1 · coronal · right · 4.0mm · 0.59mm/px · 6 of 30 slices shown]
[im 1/30]
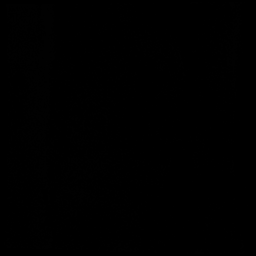
[im 6/30]
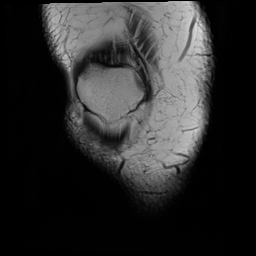
[im 12/30]
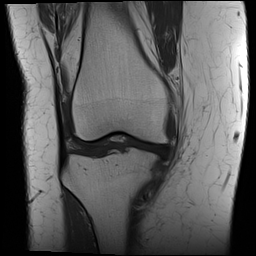
[im 18/30]
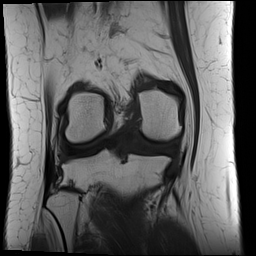
[im 24/30]
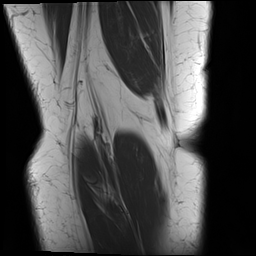
[im 30/30]
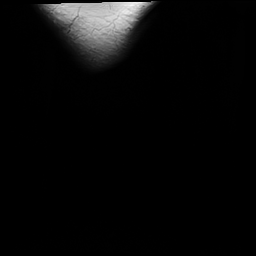

[Series 11: PD fat-sat · coronal · right · 4.0mm · 0.59mm/px · 6 of 30 slices shown (1 of 2)]
[im 1/30]
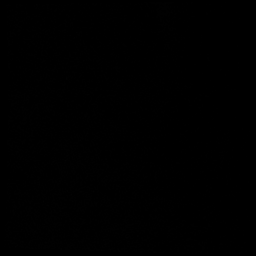
[im 6/30]
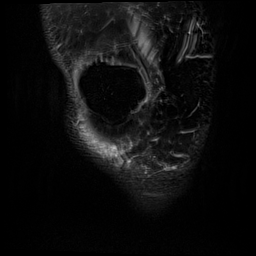
[im 12/30]
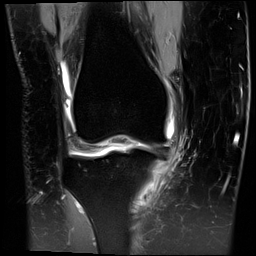
[im 18/30]
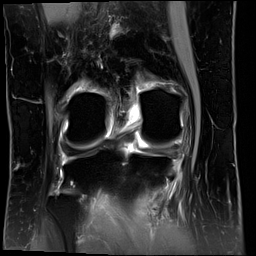
[im 24/30]
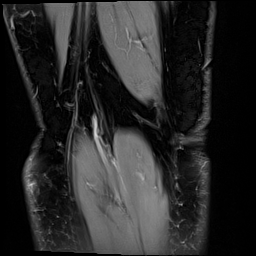
[im 30/30]
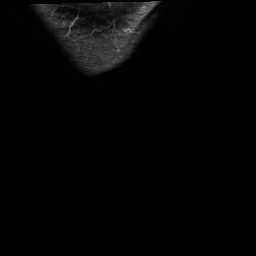

[Series 12: PD fat-sat · sagittal · right · 3.0mm · 0.59mm/px · 6 of 32 slices shown (2 of 2)]
[im 1/32]
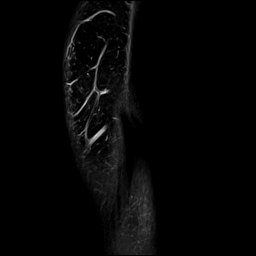
[im 7/32]
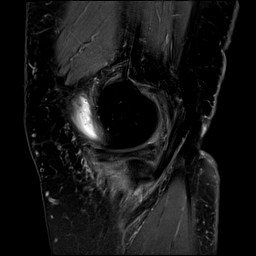
[im 13/32]
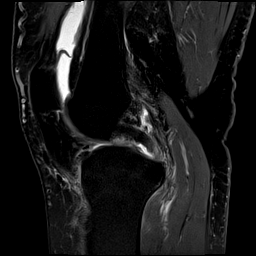
[im 19/32]
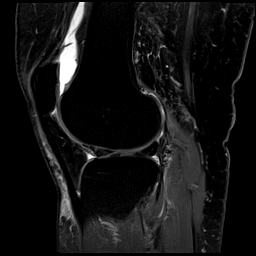
[im 25/32]
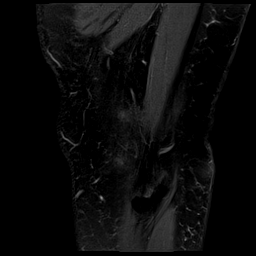
[im 32/32]
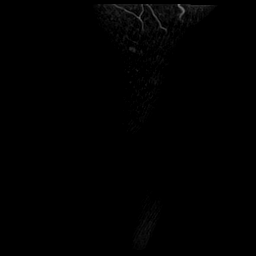

[Series 13: T2 fat-sat · sagittal · right · 3.0mm · 0.59mm/px · 7 of 35 slices shown (3 of 3)]
[im 1/35]
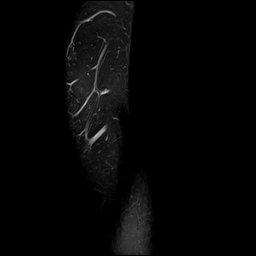
[im 6/35]
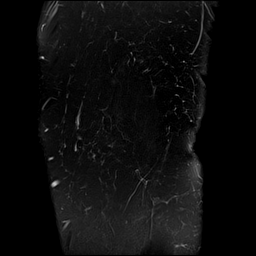
[im 12/35]
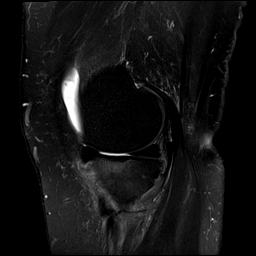
[im 18/35]
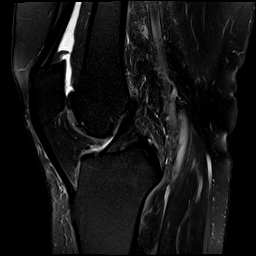
[im 23/35]
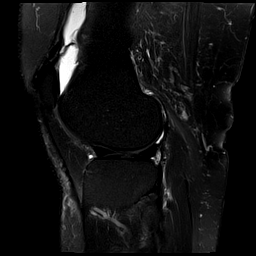
[im 29/35]
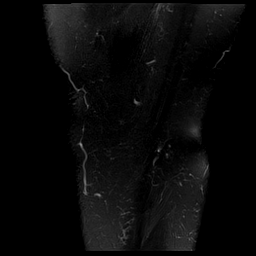
[im 35/35]
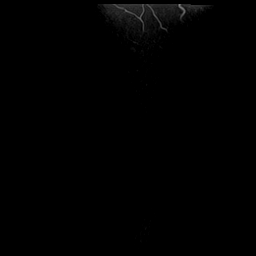

[Series 14: PD · coronal · right · 2.0mm · 0.47mm/px · 3 of 16 slices shown]
[im 1/16]
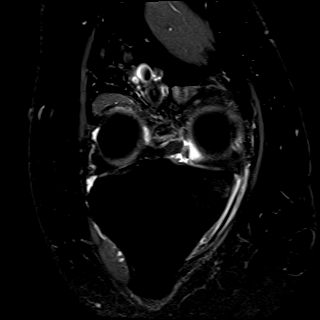
[im 8/16]
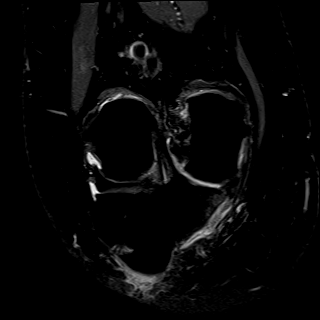
[im 16/16]
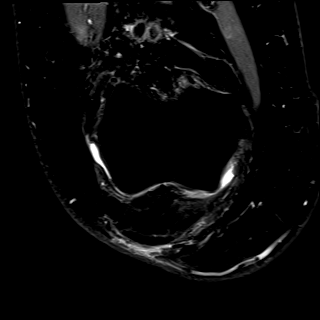

[40 of 40 positions shown; findings below may reference images not displayed]

FINDINGS: MENISCI

Medial meniscus: Radial tear of the root of the posterior horn with
phantom meniscus sign on image 20 of series 12. Mild free edge
irregularity/truncation in the midbody and adjacent anterior horn,
with mildly accentuated grade 1 intrameniscal signal.

Lateral meniscus:  Unremarkable

LIGAMENTS

Cruciates:  Unremarkable

Collaterals:  Unremarkable

CARTILAGE

Patellofemoral: Moderate to prominent chondral thinning especially
along the medial patellar facet, posterior patellar apex, and medial
portion of the lateral patellar facet, with small foci of
subcortical marrow edema especially in these most affected regions.

Medial: Moderate degenerative chondral thinning with marginal
spurring. Subcortical stress fracture versus non-fragmented
osteochondral lesion in the medial tibial plateau measuring 0.9 by
1.8 cm, with surrounding marrow edema.

Lateral:  Mild marginal spurring.

Joint:  Small to moderate knee effusion.

Popliteal Fossa: Low-grade infiltrative edema. Mild pes anserine
bursitis.

Extensor Mechanism: Low-grade edema along the inferior portion of
the medial patellar retinaculum, making it difficult to exclude a
mild sprain.

Bones:  No additional bony findings.

Other: No supplemental non-categorized findings.
IMPRESSION: 1. Radial tear of the root of the posterior horn medial meniscus.
2. Subcortical stress fracture versus non-fragmented osteochondral
lesion in the medial tibial plateau with surrounding marrow edema.
3. Moderate to prominent chondral thinning in the patellofemoral
joint and medial compartment.
4. Small to moderate knee effusion with mild pes anserine bursitis.
5. Low-grade edema along the inferior portion of the medial patellar
retinaculum, making it difficult to exclude a mild sprain.

## 2022-04-20 ENCOUNTER — Other Ambulatory Visit: Payer: Self-pay | Admitting: Family Medicine

## 2022-04-20 DIAGNOSIS — I10 Essential (primary) hypertension: Secondary | ICD-10-CM

## 2022-04-20 DIAGNOSIS — G4700 Insomnia, unspecified: Secondary | ICD-10-CM

## 2022-04-22 ENCOUNTER — Ambulatory Visit (INDEPENDENT_AMBULATORY_CARE_PROVIDER_SITE_OTHER): Payer: PRIVATE HEALTH INSURANCE | Admitting: Nurse Practitioner

## 2022-04-22 ENCOUNTER — Encounter: Payer: Self-pay | Admitting: Nurse Practitioner

## 2022-04-22 ENCOUNTER — Encounter: Payer: Self-pay | Admitting: Family Medicine

## 2022-04-22 VITALS — BP 124/78 | Ht 63.0 in | Wt 173.0 lb

## 2022-04-22 DIAGNOSIS — Z01419 Encounter for gynecological examination (general) (routine) without abnormal findings: Secondary | ICD-10-CM | POA: Diagnosis not present

## 2022-04-22 DIAGNOSIS — Z780 Asymptomatic menopausal state: Secondary | ICD-10-CM

## 2022-04-22 DIAGNOSIS — M8589 Other specified disorders of bone density and structure, multiple sites: Secondary | ICD-10-CM | POA: Diagnosis not present

## 2022-04-22 NOTE — Progress Notes (Signed)
? ?  Katrina Simpson 06/03/1956 [id number] ? ? ?History:  66 y.o. G3P0004 presents for annual exam without GYN complaints. Postmenopausal - no HRT, no bleeding. 1996 LGSIL, subsequent paps normal. Normal mammogram history. HTN, HLD managed by PCP.  ? ?Gynecologic History ?Patient's last menstrual period was 12/28/1998. ?  ?Contraception/Family planning: post menopausal status ?Sexually active: Yes ? ?Health Maintenance ?Last Pap: 04/17/2020. Results were: Normal, 5-year repeat ?Last mammogram: 03/11/2018. Results were: Normal ?Last colonoscopy: 05/12/2018. Results: Normal. 5-year recall ?Last Dexa: 10/2020. Results were: T-score -2.1, FRAX 11% / 1.9% ? ?Past medical history, past surgical history, family history and social history were all reviewed and documented in the EPIC chart. Married. 4 sons, 5 grandchildren age 1-28 yo. 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  ? ?ROS:  A ROS was performed and pertinent positives and negatives are included. ? ?Exam: ? ?Vitals:  ? 04/22/22 1022  ?BP: 124/78  ?Weight: 173 lb (78.5 kg)  ?Height: '5\' 3"'$  (1.6 m)  ? ? ?Body mass index is 30.65 kg/m?. ? ?General appearance:  Normal ?Thyroid:  Symmetrical, normal in size, without palpable masses or nodularity. ?Respiratory ? Auscultation:  Clear without wheezing or rhonchi ?Cardiovascular ? Auscultation:  Regular rate, without rubs, murmurs or gallops ? Edema/varicosities:  Not grossly evident ?Abdominal ? Soft,nontender, without masses, guarding or rebound. ? Liver/spleen:  No organomegaly noted ? Hernia:  None appreciated ? Skin ? Inspection:  Grossly normal ?Breasts: Examined lying and sitting.  ? Right: Without masses, retractions, nipple discharge or axillary adenopathy. ? ? Left: Without masses, retractions, nipple discharge or axillary adenopathy. ?Gentitourinary  ? Inguinal/mons:  Normal without inguinal adenopathy ? External genitalia:  Scattered sebaceous cysts on both labia majoras. Non-infectious appearing.   ? BUS/Urethra/Skene's glands:  Normal ? Vagina:  Normal appearing with normal color and discharge, no lesions. Atrophic changes ? Cervix:  Normal appearing without discharge or lesions ? Uterus:  Normal in size, shape and contour.  Midline and mobile, nontender ? Adnexa/parametria:   ?  Rt: Normal in size, without masses or tenderness. ?  Lt: Normal in size, without masses or tenderness. ? Anus and perineum: Normal ? Digital rectal exam: Normal sphincter tone without palpated masses or tenderness ? ?Patient informed chaperone available to be present for breast and pelvic exam. Patient has requested no chaperone to be present. Patient has been advised what will be completed during breast and pelvic exam.  ? ? ?Assessment/Plan:  66 y.o. G3P0004 for annual exam.  ? ?Well female exam with routine gynecological exam - Education provided on SBEs, importance of preventative screenings, current guidelines, high calcium diet, regular exercise, and multivitamin daily. Labs with PCP.  ? ?Osteopenia of multiple sites - 10/2020 T-score -2.1. Continue daily Vitamin D supplement and increase exercise. Will repeat DXA later this year.  ? ?Screening for cervical cancer - Normal Pap history. No longer screening per guidelines.  ? ?Screening for breast cancer - Normal mammogram history.  She is overdue and plans to schedule this soon. Normal breast exam today. ? ?Screening for colon cancer - 2019 colonoscopy. Will repeat at 5-year interval per GI's recommended. ? ?Return in 1 year for annual.  ? ? ? ?[place], 10:53 AM 04/22/2022 ? ? ?

## 2022-04-23 ENCOUNTER — Ambulatory Visit: Payer: No Typology Code available for payment source | Admitting: Family Medicine

## 2022-05-04 ENCOUNTER — Ambulatory Visit: Payer: No Typology Code available for payment source | Admitting: Family Medicine

## 2022-05-11 ENCOUNTER — Telehealth: Payer: Self-pay

## 2022-05-11 NOTE — Telephone Encounter (Signed)
Copied from [place] [id number]. Topic: General - Other ?>> May 11, 2022 12:58 PM Valere Dross wrote: ?Reason for CRM: Pt called in requesting to get some labs done before her next week appts, and requested if someone can order them for her, please advise. ?

## 2022-05-12 NOTE — Telephone Encounter (Signed)
She had labs ordered in 10/22 that were never done. She should just get those. Can reprint lab req. She should have them fasting 2 days before appt. ?

## 2022-05-12 NOTE — Telephone Encounter (Signed)
Left detailed message advising as below. 

## 2022-05-13 NOTE — Progress Notes (Signed)
I,Sulibeya S Dimas,acting as a Education administrator for Lavon Paganini, MD.,have documented all relevant documentation on the behalf of Lavon Paganini, MD,as directed by  Lavon Paganini, MD while in the presence of Lavon Paganini, MD.   Established patient visit   Patient: Katrina Simpson   DOB: 04-28-1956   66 y.o. Female  MRN: [id number] Visit Date: 05/18/2022  Today's healthcare provider: Lavon Paganini, MD   Chief Complaint  Patient presents with   Hypertension   Subjective    HPI  Hypertension, follow-up  BP Readings from Last 3 Encounters:  05/18/22 116/86  04/22/22 124/78  10/23/21 108/78   Wt Readings from Last 3 Encounters:  05/18/22 177 lb 3.2 oz (80.4 kg)  04/22/22 173 lb (78.5 kg)  10/23/21 163 lb 1.6 oz (74 kg)     She was last seen for hypertension 6 months ago.  BP at that visit was 108/78. Management since that visit includes continue current medication.  She reports excellent compliance with treatment. She is not having side effects.  She is following a Regular diet. She is not exercising. She does not smoke.  Outside blood pressures are not being checked. Symptoms: No chest pain No chest pressure  No palpitations No syncope  No dyspnea No orthopnea  No paroxysmal nocturnal dyspnea No lower extremity edema   Pertinent labs Lab Results  Component Value Date   CHOL 188 05/13/2022   HDL 50 05/13/2022   LDLCALC 116 (H) 05/13/2022   TRIG 122 05/13/2022   CHOLHDL 3.8 05/13/2022   Lab Results  Component Value Date   NA 141 05/13/2022   K 4.1 05/13/2022   CREATININE 1.16 (H) 05/13/2022   GFRNONAA >60 06/16/2021   GLUCOSE 99 05/13/2022     The 10-year ASCVD risk score (Arnett DK, et al., 2019) is: 7.6%  ---------------------------------------------------------------------------------------------------   Medications: Outpatient Medications Prior to Visit  Medication Sig   chlorthalidone (HYGROTON) 25 MG tablet Take 1 tablet (25 mg  total) by mouth daily.   cholecalciferol (VITAMIN D3) 25 MCG (1000 UNIT) tablet Take 1,000 Units by mouth daily.   CRANBERRY PO Take by mouth.   diclofenac (VOLTAREN) 75 MG EC tablet Take 1 tablet (75 mg total) by mouth 2 times daily.   nortriptyline (PAMELOR) 50 MG capsule Take 1 capsule (50 mg total) by mouth at bedtime.   omeprazole (PRILOSEC OTC) 20 MG tablet Take 20 mg by mouth daily as needed (acid reflux).   polyethylene glycol (MIRALAX / GLYCOLAX) 17 g packet Take 17 g by mouth daily.   [DISCONTINUED] rosuvastatin (CRESTOR) 5 MG tablet TAKE 1 TABLET (5 MG TOTAL) BY MOUTH DAILY. PLEASE CANCEL PRAVASTATIN.   No facility-administered medications prior to visit.    Review of Systems  Constitutional:  Negative for appetite change and fatigue.  Respiratory:  Negative for chest tightness and shortness of breath.   Cardiovascular:  Negative for chest pain, palpitations and leg swelling.       Objective    BP 116/86 (BP Location: Left Arm, Patient Position: Sitting, Cuff Size: Large)   Pulse 76   Temp 98.4 F (36.9 C) (Oral)   Resp 16   Ht '5\' 3"'$  (1.6 m)   Wt 177 lb 3.2 oz (80.4 kg)   LMP 12/28/1998   SpO2 97%   BMI 31.39 kg/m  BP Readings from Last 3 Encounters:  05/18/22 116/86  04/22/22 124/78  10/23/21 108/78   Wt Readings from Last 3 Encounters:  05/18/22 177 lb 3.2 oz (  80.4 kg)  04/22/22 173 lb (78.5 kg)  10/23/21 163 lb 1.6 oz (74 kg)      Physical Exam Vitals reviewed.  Constitutional:      General: She is not in acute distress.    Appearance: Normal appearance. She is well-developed. She is not diaphoretic.  HENT:     Head: Normocephalic and atraumatic.  Eyes:     General: No scleral icterus.    Conjunctiva/sclera: Conjunctivae normal.  Neck:     Thyroid: No thyromegaly.  Cardiovascular:     Rate and Rhythm: Normal rate and regular rhythm.     Pulses: Normal pulses.     Heart sounds: Normal heart sounds. No murmur heard. Pulmonary:     Effort:  Pulmonary effort is normal. No respiratory distress.     Breath sounds: Normal breath sounds. No wheezing, rhonchi or rales.  Musculoskeletal:     Cervical back: Neck supple.     Right lower leg: No edema.     Left lower leg: No edema.  Lymphadenopathy:     Cervical: No cervical adenopathy.  Skin:    General: Skin is warm and dry.     Findings: No rash.  Neurological:     Mental Status: She is alert and oriented to person, place, and time. Mental status is at baseline.  Psychiatric:        Mood and Affect: Mood normal.        Behavior: Behavior normal.      No results found for any visits on 05/18/22.  Assessment & Plan     Problem List Items Addressed This Visit       Cardiovascular and Mediastinum   Essential hypertension, benign - Primary    Chronic and well controlled Continue current medications Reviewed recent metabolic panel F/u in 6 months       Relevant Medications   rosuvastatin (CRESTOR) 5 MG tablet     Other   Hyperlipidemia    Previously well controlled Continue statin Reviewed FLP and CMP      Relevant Medications   rosuvastatin (CRESTOR) 5 MG tablet   Obesity    Discussed importance of healthy weight management Discussed diet and exercise       Other Visit Diagnoses     Screening mammogram for breast cancer       Relevant Orders   MM 3D SCREEN BREAST BILATERAL        Return in about 6 months (around 11/18/2022) for CPE.      I, Lavon Paganini, MD, have reviewed all documentation for this visit. The documentation on 05/18/22 for the exam, diagnosis, procedures, and orders are all accurate and complete.   Selassie Spatafore, Dionne Bucy, MD, MPH [facility]

## 2022-05-14 LAB — COMPREHENSIVE METABOLIC PANEL
ALT: 34 IU/L — ABNORMAL HIGH (ref 0–32)
AST: 26 IU/L (ref 0–40)
Albumin/Globulin Ratio: 1.7 (ref 1.2–2.2)
Albumin: 4.6 g/dL (ref 3.8–4.8)
Alkaline Phosphatase: 97 IU/L (ref 44–121)
BUN/Creatinine Ratio: 19 (ref 12–28)
BUN: 22 mg/dL (ref 8–27)
Bilirubin Total: 0.5 mg/dL (ref 0.0–1.2)
CO2: 24 mmol/L (ref 20–29)
Calcium: 10.2 mg/dL (ref 8.7–10.3)
Chloride: 98 mmol/L (ref 96–106)
Creatinine, Ser: 1.16 mg/dL — ABNORMAL HIGH (ref 0.57–1.00)
Globulin, Total: 2.7 g/dL (ref 1.5–4.5)
Glucose: 99 mg/dL (ref 70–99)
Potassium: 4.1 mmol/L (ref 3.5–5.2)
Sodium: 141 mmol/L (ref 134–144)
Total Protein: 7.3 g/dL (ref 6.0–8.5)
eGFR: 52 mL/min/{1.73_m2} — ABNORMAL LOW (ref >59)

## 2022-05-14 LAB — LIPID PANEL
Chol/HDL Ratio: 3.8 ratio (ref 0.0–4.4)
Cholesterol, Total: 188 mg/dL (ref 100–199)
HDL: 50 mg/dL (ref >39)
LDL Chol Calc (NIH): 116 mg/dL — ABNORMAL HIGH (ref 0–99)
Triglycerides: 122 mg/dL (ref 0–149)
VLDL Cholesterol Cal: 22 mg/dL (ref 5–40)

## 2022-05-18 ENCOUNTER — Ambulatory Visit (INDEPENDENT_AMBULATORY_CARE_PROVIDER_SITE_OTHER): Payer: No Typology Code available for payment source | Admitting: Family Medicine

## 2022-05-18 ENCOUNTER — Encounter: Payer: Self-pay | Admitting: Family Medicine

## 2022-05-18 VITALS — BP 116/86 | HR 76 | Temp 98.4°F | Resp 16 | Ht 63.0 in | Wt 177.2 lb

## 2022-05-18 DIAGNOSIS — E669 Obesity, unspecified: Secondary | ICD-10-CM

## 2022-05-18 DIAGNOSIS — Z6831 Body mass index (BMI) 31.0-31.9, adult: Secondary | ICD-10-CM

## 2022-05-18 DIAGNOSIS — Z23 Encounter for immunization: Secondary | ICD-10-CM | POA: Diagnosis not present

## 2022-05-18 DIAGNOSIS — Z1231 Encounter for screening mammogram for malignant neoplasm of breast: Secondary | ICD-10-CM

## 2022-05-18 DIAGNOSIS — E782 Mixed hyperlipidemia: Secondary | ICD-10-CM | POA: Diagnosis not present

## 2022-05-18 DIAGNOSIS — I10 Essential (primary) hypertension: Secondary | ICD-10-CM | POA: Diagnosis not present

## 2022-05-18 MED ORDER — ROSUVASTATIN CALCIUM 5 MG PO TABS: 5.0000 mg | ORAL_TABLET | Freq: Every day | ORAL | 3 refills | Status: DC

## 2022-05-18 NOTE — Assessment & Plan Note (Signed)
Chronic and well controlled Continue current medications Reviewed recent metabolic panel F/u in 6 months

## 2022-05-18 NOTE — Assessment & Plan Note (Signed)
Previously well controlled Continue statin Reviewed FLP and CMP

## 2022-05-18 NOTE — Addendum Note (Signed)
Addended by: Shawna Orleans on: 05/18/2022 04:17 PM   Modules accepted: Orders

## 2022-05-18 NOTE — Assessment & Plan Note (Signed)
Discussed importance of healthy weight management Discussed diet and exercise  

## 2022-06-03 IMAGING — DX DG KNEE 1-2V PORT*R*
2 series · 2 of 2 positions shown · non-contrast
Comparison: None.

CLINICAL DATA: 64-year-old female status post right knee
arthroplasty.

EXAM:
PORTABLE RIGHT KNEE - 1-2 VIEW

[knee ap]
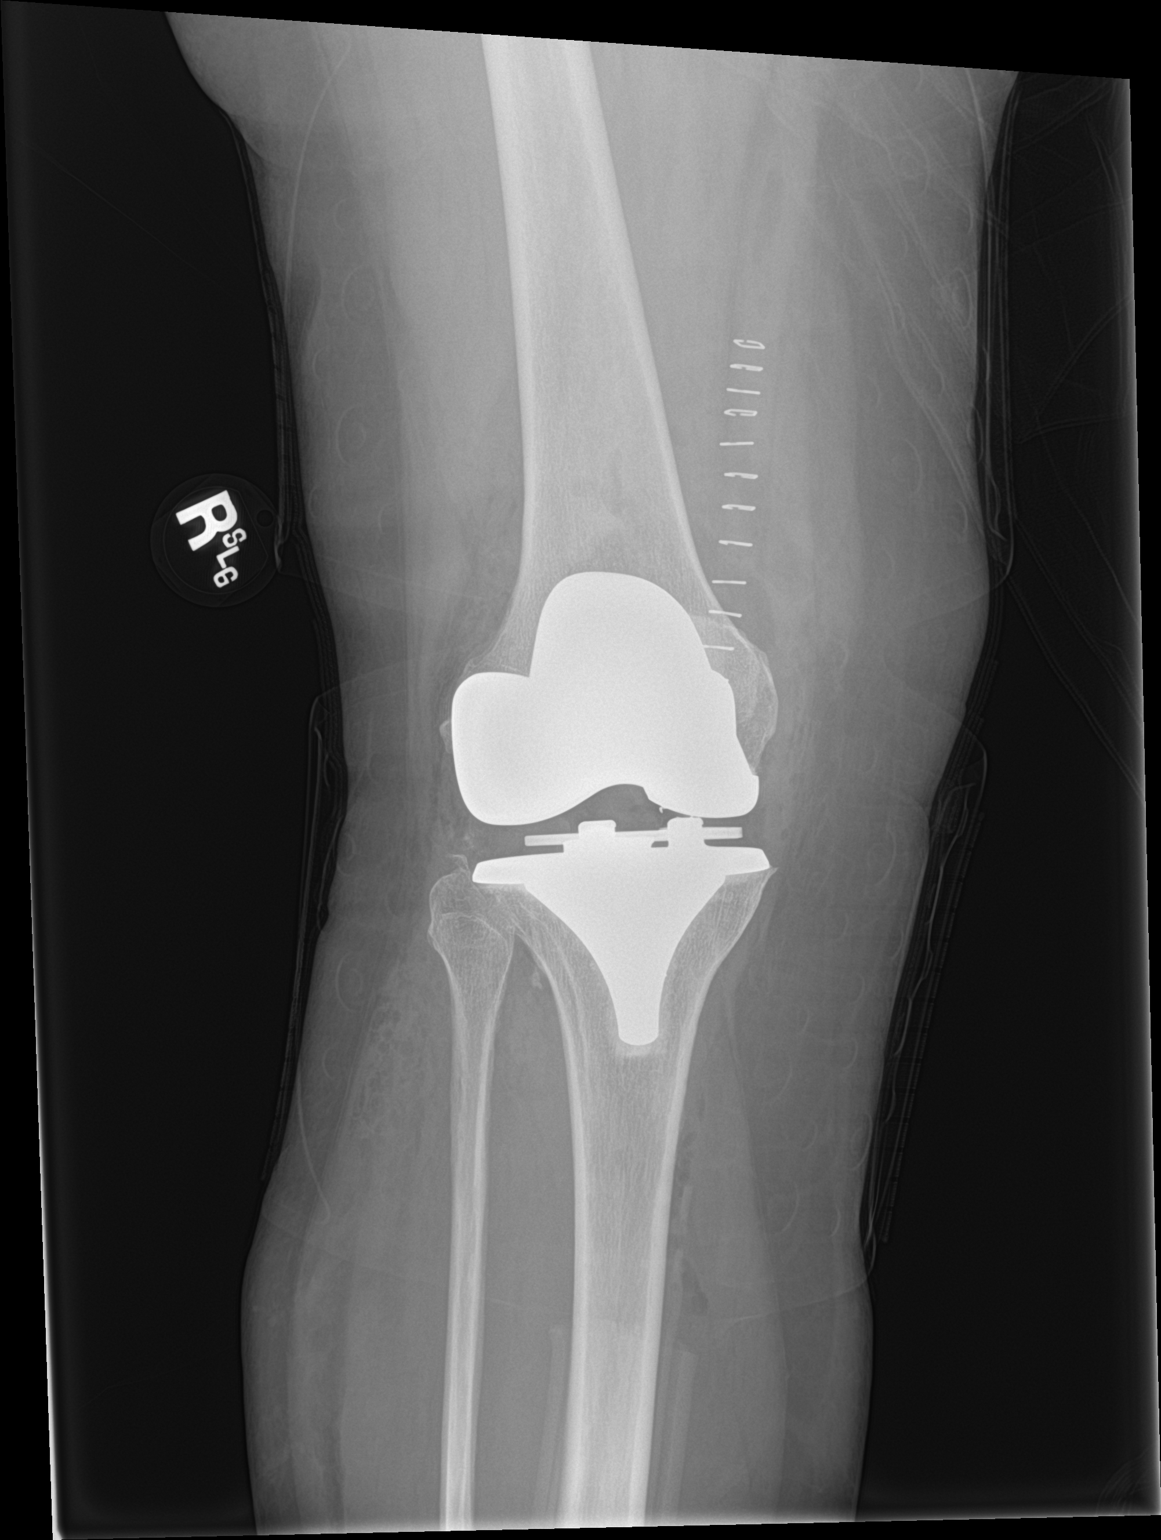

[knee lat]
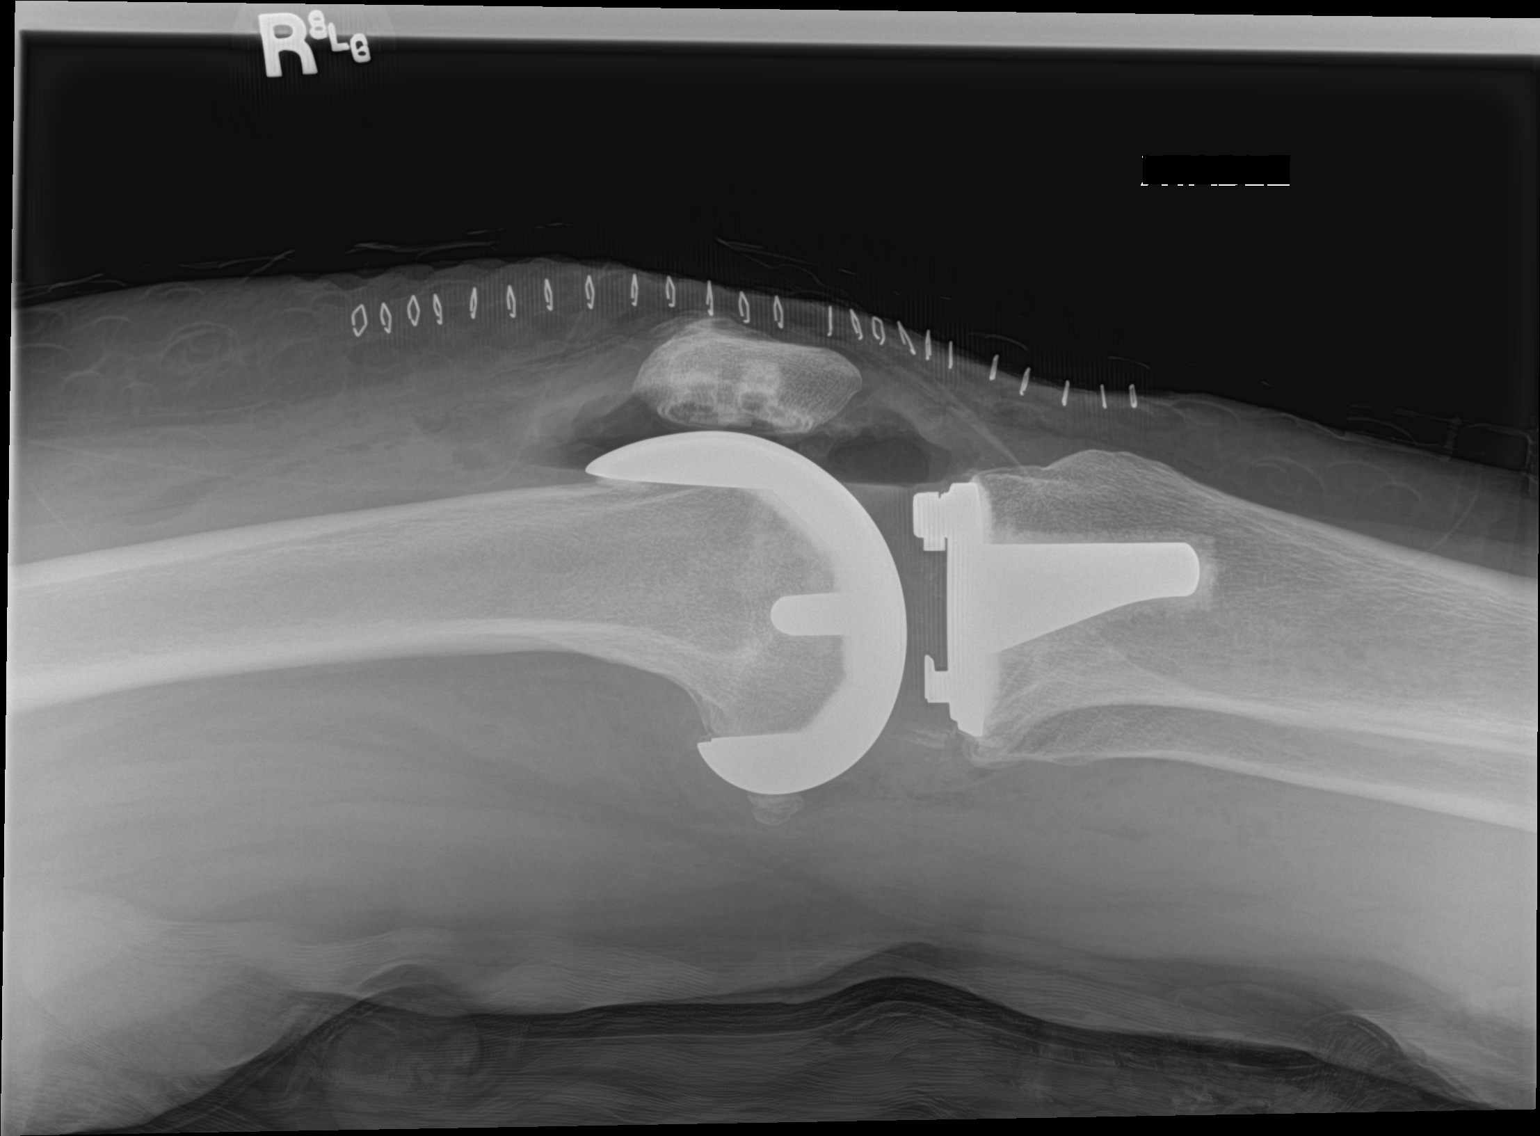

[2 of 2 positions shown; findings below may reference images not displayed]

FINDINGS: There is a total right knee arthroplasty. The arthroplasty
components appear intact and in anatomic alignment. There is no
acute fracture or dislocation. Postsurgical changes including air
and fluid within the joint space as well as anterior knee cutaneous
clips.
IMPRESSION: Status post total right knee arthroplasty. No acute fracture or
dislocation.

## 2022-06-16 ENCOUNTER — Other Ambulatory Visit: Payer: Self-pay | Admitting: Family Medicine

## 2022-07-29 ENCOUNTER — Ambulatory Visit
Admission: RE | Admit: 2022-07-29 | Discharge: 2022-07-29 | Disposition: A | Discharge status: Home / Self Care | Payer: PRIVATE HEALTH INSURANCE | Source: Ambulatory Visit | Attending: Family Medicine | Admitting: Family Medicine

## 2022-07-29 DIAGNOSIS — Z1231 Encounter for screening mammogram for malignant neoplasm of breast: Secondary | ICD-10-CM

## 2022-07-30 NOTE — Progress Notes (Signed)
Incomplete breast cancer screening due to asymmetry. You will be contacted to obtain further imaging.  Katrina Simpson, [address] [place], [place] [address] [phone] (phone) [phone] (fax) [place]

## 2022-07-31 ENCOUNTER — Other Ambulatory Visit: Payer: Self-pay | Admitting: Family Medicine

## 2022-07-31 DIAGNOSIS — R928 Other abnormal and inconclusive findings on diagnostic imaging of breast: Secondary | ICD-10-CM

## 2022-08-06 ENCOUNTER — Ambulatory Visit
Admission: RE | Admit: 2022-08-06 | Discharge: 2022-08-06 | Disposition: A | Discharge status: Home / Self Care | Payer: PRIVATE HEALTH INSURANCE | Source: Ambulatory Visit | Attending: Family Medicine | Admitting: Family Medicine

## 2022-08-06 ENCOUNTER — Ambulatory Visit: Payer: PRIVATE HEALTH INSURANCE

## 2022-08-06 DIAGNOSIS — R928 Other abnormal and inconclusive findings on diagnostic imaging of breast: Secondary | ICD-10-CM

## 2022-08-06 NOTE — Progress Notes (Signed)
[facility] [place]  Normal mammogram; repeat in 1 year.  Please let us know if you have any questions.  Thank you,  Abri Vacca, FNP 

## 2022-08-26 ENCOUNTER — Other Ambulatory Visit: Payer: Self-pay | Admitting: Family Medicine

## 2022-10-07 ENCOUNTER — Ambulatory Visit: Payer: Self-pay | Admitting: *Deleted

## 2022-10-07 NOTE — Telephone Encounter (Signed)
  Chief Complaint: +COVID Symptoms: dry cough-better today, low grade fever, body aches, headache Frequency: symptoms late yesterday afternoon Pertinent Negatives: Patient denies SOB Disposition: '[]'$ ED /'[]'$ Urgent Care (no appt availability in office) / '[]'$ Appointment(In office/virtual)/ '[]'$  [facility]/ '[x]'$ Home Care/ '[]'$ Refused Recommended Disposition /'[]'$ [facility]/ '[]'$  Follow-up with PCP Additional Notes: Patient advised per COVID protocol- patient reports she feels some better today- she is going to see how she does tonight- and if she feels worse/same will call for virtual visit to get medication. Patient advised per COVID protocol- treatment/isolation

## 2022-10-07 NOTE — Telephone Encounter (Signed)
Summary: covid +   Pt states she test covid + today and inquiring if there is any particular medicine she should be taking   Please advise      Reason for Disposition  [1] HIGH RISK patient (e.g., weak immune system, age > 31 years, obesity with BMI 30 or higher, pregnant, chronic lung disease or other chronic medical condition) AND [2] COVID symptoms (e.g., cough, fever)  (Exceptions: Already seen by PCP and no new or worsening symptoms.)  Answer Assessment - Initial Assessment Questions 1. COVID-19 DIAGNOSIS: "How do you know that you have COVID?" (e.g., positive lab test or self-test, diagnosed by doctor or NP/PA, symptoms after exposure).     + COVID home test 2. COVID-19 EXPOSURE: "Was there any known exposure to COVID before the symptoms began?" CDC Definition of close contact: within 6 feet (2 meters) for a total of 15 minutes or more over a 24-hour period.      Exposure at church 3. ONSET: "When did the COVID-19 symptoms start?"      Late yesterday afternoon 4. WORST SYMPTOM: "What is your worst symptom?" (e.g., cough, fever, shortness of breath, muscle aches)     Body ache, headache 5. COUGH: "Do you have a cough?" If Yes, ask: "How bad is the cough?"       Yes-kept patient up last night-dry cough 6. FEVER: "Do you have a fever?" If Yes, ask: "What is your temperature, how was it measured, and when did it start?"     Yes-low grade, 100.5-oral 7. RESPIRATORY STATUS: "Describe your breathing?" (e.g., normal; shortness of breath, wheezing, unable to speak)      Normals 8. BETTER-SAME-WORSE: "Are you getting better, staying the same or getting worse compared to yesterday?"  If getting worse, ask, "In what way?"     better 9. OTHER SYMPTOMS: "Do you have any other symptoms?"  (e.g., chills, fatigue, headache, loss of smell or taste, muscle pain, sore throat)     Headache, body aches 10. HIGH RISK DISEASE: "Do you have any chronic medical problems?" (e.g., asthma, heart or lung  disease, weak immune system, obesity, etc.)       no 11. VACCINE: "Have you had the COVID-19 vaccine?" If Yes, ask: "Which one, how many shots, when did you get it?"       Yes-not for this year 12. PREGNANCY: "Is there any chance you are pregnant?" "When was your last menstrual period?"          13. O2 SATURATION MONITOR:  "Do you use an oxygen saturation monitor (pulse oximeter) at home?" If Yes, ask "What is your reading (oxygen level) today?" "What is your usual oxygen saturation reading?" (e.g., 95%)  Protocols used: Coronavirus (COVID-19) Diagnosed or Suspected-A-AH

## 2022-10-08 NOTE — Telephone Encounter (Signed)
Noted  

## 2022-10-21 ENCOUNTER — Ambulatory Visit (INDEPENDENT_AMBULATORY_CARE_PROVIDER_SITE_OTHER): Payer: PRIVATE HEALTH INSURANCE

## 2022-10-21 DIAGNOSIS — Z23 Encounter for immunization: Secondary | ICD-10-CM | POA: Diagnosis not present

## 2022-11-18 NOTE — Progress Notes (Signed)
Complete physical exam   Patient: Katrina Simpson   DOB: October 26, 1956   66 y.o. Female  MRN: [id number] Visit Date: 11/23/2022  Today's healthcare provider: Lavon Paganini, MD   Chief Complaint  Patient presents with   follow-up chronic disease   Subjective    Katrina Simpson is a 66 y.o. female who presents today for a complete physical exam.  She reports consuming a general diet. The patient does not participate in regular exercise at present. She generally feels well. She reports sleeping well. She does not have additional problems to discuss today.  HPI    Past Medical History:  Diagnosis Date   Arthritis    GERD (gastroesophageal reflux disease)    Headache    EVERY DAY HEADACHES   Hepatitis    Hyperlipidemia    Hypertension    Insomnia    Osteopenia 04/2017   T score -2.1 FRAX 4.6%/0.6%   Past Surgical History:  Procedure Laterality Date   CESAREAN SECTION     X3   COLONOSCOPY N/A 05/03/2015   Procedure: COLONOSCOPY;  Surgeon: Manya Silvas, MD;  Location: [facility];  Service: Endoscopy;  Laterality: N/A;   TOTAL KNEE ARTHROPLASTY Right 06/24/2021   Procedure: TOTAL KNEE ARTHROPLASTY;  Surgeon: Corky Mull, MD;  Location: [facility];  Service: Orthopedics;  Laterality: Right;   TUBAL LIGATION     Social History   Socioeconomic History   Marital status: Married    Spouse name: Not on file   Number of children: Not on file   Years of education: Not on file   Highest education level: Not on file  Occupational History   Not on file  Tobacco Use   Smoking status: Never   Smokeless tobacco: Never  Vaping Use   Vaping Use: Never used  Substance and Sexual Activity   Alcohol use: No   Drug use: No   Sexual activity: Yes    Birth control/protection: Post-menopausal  Other Topics Concern   Not on file  Social History Narrative   Not on file   Social Determinants of Health   Financial Resource Strain: Not on file  Food Insecurity: Not on  file  Transportation Needs: Not on file  Physical Activity: Not on file  Stress: Not on file  Social Connections: Not on file  Intimate Partner Violence: Not on file   Family Status  Relation Name Status   Mother  (Not Specified)   Father  Deceased   Sister  Alive   Brother  Alive   Son  Alive   Neg Hx  (Not Specified)   Family History  Problem Relation Age of Onset   Hypertension Mother 57   Stroke Father 56   Healthy Brother    Other Son        Cervical dystonia   Colon cancer Neg Hx    Breast cancer Neg Hx    Allergies  Allergen Reactions   Macrobid [Nitrofurantoin Macrocrystal]     Don't remember    Penicillins Swelling    FINGERS ONLY-AGE 66   Sulfa Antibiotics Itching    Patient Care Team: Virginia Crews, MD as PCP - General (Family Medicine)   Medications: Outpatient Medications Prior to Visit  Medication Sig   cholecalciferol (VITAMIN D3) 25 MCG (1000 UNIT) tablet Take 1,000 Units by mouth daily.   CRANBERRY PO Take by mouth.   diclofenac (VOLTAREN) 75 MG EC tablet Take 1 tablet (75 mg total) by  mouth 2 (two) times daily.   omeprazole (PRILOSEC OTC) 20 MG tablet Take 20 mg by mouth daily as needed (acid reflux).   polyethylene glycol (MIRALAX / GLYCOLAX) 17 g packet Take 17 g by mouth daily.   [DISCONTINUED] chlorthalidone (HYGROTON) 25 MG tablet Take 1 tablet (25 mg total) by mouth daily.   [DISCONTINUED] nortriptyline (PAMELOR) 50 MG capsule Take 1 capsule (50 mg total) by mouth at bedtime.   [DISCONTINUED] rosuvastatin (CRESTOR) 5 MG tablet Take 1 tablet (5 mg total) by mouth daily. Please cancel pravastatin.   No facility-administered medications prior to visit.    Review of Systems  Constitutional: Negative.   HENT: Negative.    Eyes: Negative.   Respiratory: Negative.    Cardiovascular: Negative.   Gastrointestinal: Negative.   Endocrine: Negative.   Genitourinary: Negative.   Musculoskeletal: Negative.   Skin: Negative.    Allergic/Immunologic: Negative.   Neurological: Negative.   Hematological: Negative.       Objective    BP 120/80 (BP Location: Left Arm, Patient Position: Sitting, Cuff Size: Normal)   Pulse 67   Temp 97.9 F (36.6 C) (Oral)   Resp 16   Ht '5\' 4"'$  (1.626 m)   Wt 170 lb 9.6 oz (77.4 kg)   LMP 12/28/1998   BMI 29.28 kg/m     Physical Exam Vitals reviewed.  Constitutional:      General: She is not in acute distress.    Appearance: Normal appearance. She is well-developed. She is not diaphoretic.  HENT:     Head: Normocephalic and atraumatic.     Right Ear: Tympanic membrane, ear canal and external ear normal.     Left Ear: Tympanic membrane, ear canal and external ear normal.     Nose: Nose normal.     Mouth/Throat:     Mouth: Mucous membranes are moist.     Pharynx: Oropharynx is clear. No oropharyngeal exudate.  Eyes:     General: No scleral icterus.    Conjunctiva/sclera: Conjunctivae normal.     Pupils: Pupils are equal, round, and reactive to light.  Neck:     Thyroid: No thyromegaly.  Cardiovascular:     Rate and Rhythm: Normal rate and regular rhythm.     Pulses: Normal pulses.     Heart sounds: Normal heart sounds. No murmur heard. Pulmonary:     Effort: Pulmonary effort is normal. No respiratory distress.     Breath sounds: Normal breath sounds. No wheezing or rales.  Abdominal:     General: There is no distension.     Palpations: Abdomen is soft.     Tenderness: There is no abdominal tenderness.  Musculoskeletal:        General: No deformity.     Cervical back: Neck supple.     Right lower leg: No edema.     Left lower leg: No edema.  Lymphadenopathy:     Cervical: No cervical adenopathy.  Skin:    General: Skin is warm and dry.     Findings: No rash.  Neurological:     Mental Status: She is alert and oriented to person, place, and time. Mental status is at baseline.     Gait: Gait normal.  Psychiatric:        Mood and Affect: Mood normal.         Behavior: Behavior normal.        Thought Content: Thought content normal.      Last depression screening scores  11/23/2022    3:56 PM 05/18/2022    4:02 PM 10/23/2021    3:46 PM  PHQ 2/9 Scores  PHQ - 2 Score 0 0 0  PHQ- 9 Score 0 0 0   Last fall risk screening    11/23/2022    3:56 PM  [place] in the past year? 0  Number falls in past yr: 0  Injury with Fall? 0  Risk for fall due to : No Fall Risks   Last Audit-C alcohol use screening    11/23/2022    3:57 PM  Alcohol Use Disorder Test (AUDIT)  1. How often do you have a drink containing alcohol? 0  2. How many drinks containing alcohol do you have on a typical day when you are drinking? 0  3. How often do you have six or more drinks on one occasion? 0  AUDIT-C Score 0   A score of 3 or more in women, and 4 or more in men indicates increased risk for alcohol abuse, EXCEPT if all of the points are from question 1   No results found for any visits on 11/23/22.  Assessment & Plan    Routine Health Maintenance and Physical Exam  Exercise Activities and Dietary recommendations  Goals   None     Immunization History  Administered Date(s) Administered   Fluad Quad(high Dose 65+) 09/23/2020, 09/24/2021, 10/21/2022   Influenza,inj,Quad PF,6+ Mos 10/17/2016, 10/02/2017, 10/08/2018, 08/31/2019   Influenza-Unspecified 10/20/2013, 09/28/2015   PFIZER(Purple Top)SARS-COV-2 Vaccination 01/12/2020, 02/02/2020   PNEUMOCOCCAL CONJUGATE-20 05/18/2022   Pneumococcal Conjugate-13 03/18/2020   Tdap 03/18/2020   Zoster Recombinat (Shingrix) 03/18/2020, 11/28/2020    Health Maintenance  Topic Date Due   COVID-19 Vaccine (3 - 2023-24 season) 08/28/2022   MAMMOGRAM  07/29/2024   COLONOSCOPY (Pts 45-77yr Insurance coverage will need to be confirmed)  05/02/2025   Pneumonia Vaccine 66 Years old  Completed   INFLUENZA VACCINE  Completed   DEXA SCAN  Completed   Hepatitis C Screening  Completed   Zoster  Vaccines- Shingrix  Completed   HPV VACCINES  Aged Out    Discussed health benefits of physical activity, and encouraged her to engage in regular exercise appropriate for her age and condition.  Problem List Items Addressed This Visit       Cardiovascular and Mediastinum   Essential hypertension, benign    Well controlled Continue current medications Recheck metabolic panel F/u in 6 months       Relevant Medications   rosuvastatin (CRESTOR) 5 MG tablet   chlorthalidone (HYGROTON) 25 MG tablet   Other Relevant Orders   Comprehensive metabolic panel   Lipid panel     Other   Hyperlipidemia    Previously well controlled Continue statin Repeat FLP and CMP Goal LDL < 130      Relevant Medications   rosuvastatin (CRESTOR) 5 MG tablet   chlorthalidone (HYGROTON) 25 MG tablet   Other Relevant Orders   Comprehensive metabolic panel   Lipid panel   Insomnia    Chronic and well controlled  Continue nortriptyline at current dose      Relevant Medications   nortriptyline (PAMELOR) 50 MG capsule   Other Visit Diagnoses     Encounter for annual physical exam    -  Primary        Return in about 6 months (around 05/24/2023) for chronic disease f/u.     I, ALavon Paganini MD, have reviewed all documentation for this  visit. The documentation on 11/23/22 for the exam, diagnosis, procedures, and orders are all accurate and complete.   Naketa Daddario, Dionne Bucy, MD, MPH [facility]

## 2022-11-23 ENCOUNTER — Ambulatory Visit (INDEPENDENT_AMBULATORY_CARE_PROVIDER_SITE_OTHER): Payer: No Typology Code available for payment source | Admitting: Family Medicine

## 2022-11-23 ENCOUNTER — Encounter: Payer: Self-pay | Admitting: Family Medicine

## 2022-11-23 VITALS — BP 120/80 | HR 67 | Temp 97.9°F | Resp 16 | Ht 64.0 in | Wt 170.6 lb

## 2022-11-23 DIAGNOSIS — Z0000 Encounter for general adult medical examination without abnormal findings: Secondary | ICD-10-CM | POA: Diagnosis not present

## 2022-11-23 DIAGNOSIS — E782 Mixed hyperlipidemia: Secondary | ICD-10-CM

## 2022-11-23 DIAGNOSIS — E669 Obesity, unspecified: Secondary | ICD-10-CM

## 2022-11-23 DIAGNOSIS — G4700 Insomnia, unspecified: Secondary | ICD-10-CM

## 2022-11-23 DIAGNOSIS — I10 Essential (primary) hypertension: Secondary | ICD-10-CM

## 2022-11-23 MED ORDER — NORTRIPTYLINE HCL 50 MG PO CAPS: 50.0000 mg | ORAL_CAPSULE | Freq: Every day | ORAL | 1 refills | Status: DC

## 2022-11-23 MED ORDER — CHLORTHALIDONE 25 MG PO TABS: 25.0000 mg | ORAL_TABLET | Freq: Every day | ORAL | 1 refills | Status: DC

## 2022-11-23 MED ORDER — ROSUVASTATIN CALCIUM 5 MG PO TABS: 5.0000 mg | ORAL_TABLET | Freq: Every day | ORAL | 3 refills | Status: DC

## 2022-11-23 NOTE — Assessment & Plan Note (Signed)
Chronic and well controlled  Continue nortriptyline at current dose

## 2022-11-23 NOTE — Assessment & Plan Note (Signed)
Previously well controlled Continue statin Repeat FLP and CMP Goal LDL < 130

## 2022-11-23 NOTE — Assessment & Plan Note (Signed)
Well controlled Continue current medications Recheck metabolic panel F/u in 6 months  

## 2022-11-26 LAB — COMPREHENSIVE METABOLIC PANEL
ALT: 26 IU/L (ref 0–32)
AST: 21 IU/L (ref 0–40)
Albumin/Globulin Ratio: 1.7 (ref 1.2–2.2)
Albumin: 4.7 g/dL (ref 3.9–4.9)
Alkaline Phosphatase: 85 IU/L (ref 44–121)
BUN/Creatinine Ratio: 14 (ref 12–28)
BUN: 16 mg/dL (ref 8–27)
Bilirubin Total: 0.6 mg/dL (ref 0.0–1.2)
CO2: 27 mmol/L (ref 20–29)
Calcium: 10 mg/dL (ref 8.7–10.3)
Chloride: 97 mmol/L (ref 96–106)
Creatinine, Ser: 1.15 mg/dL — ABNORMAL HIGH (ref 0.57–1.00)
Globulin, Total: 2.8 g/dL (ref 1.5–4.5)
Glucose: 104 mg/dL — ABNORMAL HIGH (ref 70–99)
Potassium: 3.6 mmol/L (ref 3.5–5.2)
Sodium: 139 mmol/L (ref 134–144)
Total Protein: 7.5 g/dL (ref 6.0–8.5)
eGFR: 52 mL/min/{1.73_m2} — ABNORMAL LOW (ref >59)

## 2022-11-26 LAB — LIPID PANEL
Chol/HDL Ratio: 3.9 ratio (ref 0.0–4.4)
Cholesterol, Total: 184 mg/dL (ref 100–199)
HDL: 47 mg/dL (ref >39)
LDL Chol Calc (NIH): 113 mg/dL — ABNORMAL HIGH (ref 0–99)
Triglycerides: 135 mg/dL (ref 0–149)
VLDL Cholesterol Cal: 24 mg/dL (ref 5–40)

## 2023-02-19 ENCOUNTER — Other Ambulatory Visit: Payer: Self-pay | Admitting: Family Medicine

## 2023-02-19 ENCOUNTER — Telehealth: Payer: Self-pay | Admitting: Family Medicine

## 2023-02-19 DIAGNOSIS — I10 Essential (primary) hypertension: Secondary | ICD-10-CM

## 2023-02-19 MED ORDER — CHLORTHALIDONE 25 MG PO TABS: 25.0000 mg | ORAL_TABLET | Freq: Every day | ORAL | 1 refills | Status: DC

## 2023-02-19 NOTE — Telephone Encounter (Signed)
[facility] faxed refill request for the following medications:    chlorthalidone (HYGROTON) 25 MG tablet   Please advise

## 2023-04-26 ENCOUNTER — Ambulatory Visit: Payer: PRIVATE HEALTH INSURANCE | Admitting: Nurse Practitioner

## 2023-05-04 ENCOUNTER — Ambulatory Visit: Payer: PRIVATE HEALTH INSURANCE | Admitting: Nurse Practitioner

## 2023-05-25 NOTE — Progress Notes (Unsigned)
I,Harith Mccadden E Marbeth Smedley,acting as a scribe for Shirlee Latch, MD.,have documented all relevant documentation on the behalf of Shirlee Latch, MD,as directed by  Shirlee Latch, MD while in the presence of Shirlee Latch, MD.   Established patient visit   Patient: Katrina Simpson   DOB: 1956-04-10   67 y.o. Female  MRN: [id number] Visit Date: 05/27/2023  Today's healthcare provider: Shirlee Latch, MD   Chief Complaint  Patient presents with   Follow-up   Subjective    HPI  Hypertension, follow-up  BP Readings from Last 3 Encounters:  05/27/23 114/75  11/23/22 120/80  05/18/22 116/86   Wt Readings from Last 3 Encounters:  05/27/23 161 lb 4.8 oz (73.2 kg)  11/23/22 170 lb 9.6 oz (77.4 kg)  05/18/22 177 lb 3.2 oz (80.4 kg)     She was last seen for hypertension 6 months ago.  BP at that visit was 120/80. Management since that visit includes Continue current medications .  She reports excellent compliance with treatment. She is not having side effects.   Outside blood pressures are not being checked. Symptoms: No chest pain No chest pressure  No palpitations No syncope  No dyspnea No orthopnea  No paroxysmal nocturnal dyspnea No lower extremity edema   Pertinent labs Lab Results  Component Value Date   CHOL 184 11/25/2022   HDL 47 11/25/2022   LDLCALC 113 (H) 11/25/2022   TRIG 135 11/25/2022   CHOLHDL 3.9 11/25/2022   Lab Results  Component Value Date   NA 139 11/25/2022   K 3.6 11/25/2022   CREATININE 1.15 (H) 11/25/2022   EGFR 52 (L) 11/25/2022   GLUCOSE 104 (H) 11/25/2022     The 10-year ASCVD risk score (Arnett DK, et al., 2019) is: 8.3%  ---------------------------------------------------------------------------------------------------  Lipid/Cholesterol, Follow-up  Last lipid panel Other pertinent labs  Lab Results  Component Value Date   CHOL 184 11/25/2022   HDL 47 11/25/2022   LDLCALC 113 (H) 11/25/2022   TRIG 135 11/25/2022    CHOLHDL 3.9 11/25/2022   Lab Results  Component Value Date   ALT 26 11/25/2022   AST 21 11/25/2022   PLT 226 06/16/2021     She was last seen for this 6 months ago.  Management since that visit includes continue statin.  She reports excellent compliance with treatment.   Symptoms: No chest pain No chest pressure/discomfort  No dyspnea No lower extremity edema  No numbness or tingling of extremity No orthopnea  No palpitations No paroxysmal nocturnal dyspnea  No speech difficulty No syncope    The 10-year ASCVD risk score (Arnett DK, et al., 2019) is: 8.3%  ---------------------------------------------------------------------------------------------------  Medications: Outpatient Medications Prior to Visit  Medication Sig   omeprazole (PRILOSEC OTC) 20 MG tablet Take 20 mg by mouth daily as needed (acid reflux).   polyethylene glycol (MIRALAX / GLYCOLAX) 17 g packet Take 17 g by mouth daily.   [DISCONTINUED] chlorthalidone (HYGROTON) 25 MG tablet Take 1 tablet (25 mg total) by mouth daily.   [DISCONTINUED] nortriptyline (PAMELOR) 50 MG capsule Take 1 capsule (50 mg total) by mouth at bedtime.   [DISCONTINUED] rosuvastatin (CRESTOR) 5 MG tablet Take 1 tablet (5 mg total) by mouth daily. Please cancel pravastatin.   [DISCONTINUED] cholecalciferol (VITAMIN D3) 25 MCG (1000 UNIT) tablet Take 1,000 Units by mouth daily.   [DISCONTINUED] CRANBERRY PO Take by mouth.   [DISCONTINUED] diclofenac (VOLTAREN) 75 MG EC tablet Take 1 tablet (75 mg total) by mouth 2 (two)  times daily.   No facility-administered medications prior to visit.    Review of Systems per HPI      Objective    BP 114/75 (BP Location: Left Arm, Patient Position: Sitting, Cuff Size: Large)   Pulse 79   Temp 97.9 F (36.6 C) (Oral)   Resp 16   Ht 5\' 4"  (1.626 m)   Wt 161 lb 4.8 oz (73.2 kg)   LMP 12/28/1998   BMI 27.69 kg/m     Physical Exam Vitals reviewed.  Constitutional:      General: She is not  in acute distress.    Appearance: Normal appearance. She is well-developed. She is not diaphoretic.  HENT:     Head: Normocephalic and atraumatic.  Eyes:     General: No scleral icterus.    Conjunctiva/sclera: Conjunctivae normal.  Neck:     Thyroid: No thyromegaly.  Cardiovascular:     Rate and Rhythm: Normal rate and regular rhythm.     Heart sounds: Normal heart sounds. No murmur heard. Pulmonary:     Effort: Pulmonary effort is normal. No respiratory distress.     Breath sounds: Normal breath sounds. No wheezing, rhonchi or rales.  Musculoskeletal:     Cervical back: Neck supple.     Right lower leg: No edema.     Left lower leg: No edema.  Lymphadenopathy:     Cervical: No cervical adenopathy.  Skin:    General: Skin is warm and dry.     Findings: No rash.  Neurological:     Mental Status: She is alert and oriented to person, place, and time. Mental status is at baseline.  Psychiatric:        Mood and Affect: Mood normal.        Behavior: Behavior normal.       No results found for any visits on 05/27/23.  Assessment & Plan     Problem List Items Addressed This Visit       Cardiovascular and Mediastinum   Essential hypertension, benign - Primary    Well controlled Continue current medications Recheck metabolic panel F/u in 6 months       Relevant Medications   chlorthalidone (HYGROTON) 25 MG tablet   rosuvastatin (CRESTOR) 5 MG tablet   Other Relevant Orders   Comprehensive metabolic panel     Other   Hyperlipidemia    Previously well controlled Continue statin Repeat FLP and CMP Goal LDL < 130      Relevant Medications   chlorthalidone (HYGROTON) 25 MG tablet   rosuvastatin (CRESTOR) 5 MG tablet   Other Relevant Orders   Comprehensive metabolic panel   Lipid panel   Insomnia   Relevant Medications   nortriptyline (PAMELOR) 50 MG capsule   Overweight    Discussed importance of healthy weight management Discussed diet and  exercise Congratulated on weight loss      Other Visit Diagnoses     Hyperglycemia       Relevant Orders   Hemoglobin A1c        Return in about 6 months (around 11/27/2023) for Welcome to Medicare.      I, Shirlee Latch, MD, have reviewed all documentation for this visit. The documentation on 05/27/23 for the exam, diagnosis, procedures, and orders are all accurate and complete.   Bacigalupo, Marzella Schlein, MD, MPH [facility]

## 2023-05-27 ENCOUNTER — Ambulatory Visit (INDEPENDENT_AMBULATORY_CARE_PROVIDER_SITE_OTHER): Payer: HMO | Admitting: Family Medicine

## 2023-05-27 ENCOUNTER — Encounter: Payer: Self-pay | Admitting: Family Medicine

## 2023-05-27 VITALS — BP 114/75 | HR 79 | Temp 97.9°F | Resp 16 | Ht 64.0 in | Wt 161.3 lb

## 2023-05-27 DIAGNOSIS — I10 Essential (primary) hypertension: Secondary | ICD-10-CM

## 2023-05-27 DIAGNOSIS — E663 Overweight: Secondary | ICD-10-CM

## 2023-05-27 DIAGNOSIS — G4700 Insomnia, unspecified: Secondary | ICD-10-CM | POA: Diagnosis not present

## 2023-05-27 DIAGNOSIS — E782 Mixed hyperlipidemia: Secondary | ICD-10-CM | POA: Diagnosis not present

## 2023-05-27 DIAGNOSIS — R739 Hyperglycemia, unspecified: Secondary | ICD-10-CM

## 2023-05-27 MED ORDER — ROSUVASTATIN CALCIUM 5 MG PO TABS: 5.0000 mg | ORAL_TABLET | Freq: Every day | ORAL | 3 refills | Status: DC

## 2023-05-27 MED ORDER — NORTRIPTYLINE HCL 50 MG PO CAPS: 50.0000 mg | ORAL_CAPSULE | Freq: Every day | ORAL | 1 refills | Status: DC

## 2023-05-27 MED ORDER — CHLORTHALIDONE 25 MG PO TABS: 25.0000 mg | ORAL_TABLET | Freq: Every day | ORAL | 1 refills | Status: DC

## 2023-05-27 NOTE — Addendum Note (Signed)
Addended by: Erasmo Downer on: 05/27/2023 09:03 AM   Modules accepted: Level of Service

## 2023-05-27 NOTE — Assessment & Plan Note (Signed)
Previously well controlled Continue statin Repeat FLP and CMP Goal LDL < 130 

## 2023-05-27 NOTE — Assessment & Plan Note (Signed)
Discussed importance of healthy weight management ?Discussed diet and exercise  ?Congratulated on weight loss ?

## 2023-05-27 NOTE — Assessment & Plan Note (Signed)
Well controlled Continue current medications Recheck metabolic panel F/u in 6 months  

## 2023-05-28 LAB — COMPREHENSIVE METABOLIC PANEL
ALT: 17 IU/L (ref 0–32)
AST: 18 IU/L (ref 0–40)
Albumin/Globulin Ratio: 1.5 (ref 1.2–2.2)
Albumin: 4.3 g/dL (ref 3.9–4.9)
Alkaline Phosphatase: 87 IU/L (ref 44–121)
BUN/Creatinine Ratio: 16 (ref 12–28)
BUN: 18 mg/dL (ref 8–27)
Bilirubin Total: 0.6 mg/dL (ref 0.0–1.2)
CO2: 26 mmol/L (ref 20–29)
Calcium: 10 mg/dL (ref 8.7–10.3)
Chloride: 99 mmol/L (ref 96–106)
Creatinine, Ser: 1.15 mg/dL — ABNORMAL HIGH (ref 0.57–1.00)
Globulin, Total: 2.9 g/dL (ref 1.5–4.5)
Glucose: 89 mg/dL (ref 70–99)
Potassium: 3.6 mmol/L (ref 3.5–5.2)
Sodium: 141 mmol/L (ref 134–144)
Total Protein: 7.2 g/dL (ref 6.0–8.5)
eGFR: 52 mL/min/{1.73_m2} — ABNORMAL LOW (ref >59)

## 2023-05-28 LAB — LIPID PANEL
Chol/HDL Ratio: 4.1 ratio (ref 0.0–4.4)
Cholesterol, Total: 178 mg/dL (ref 100–199)
HDL: 43 mg/dL (ref >39)
LDL Chol Calc (NIH): 103 mg/dL — ABNORMAL HIGH (ref 0–99)
Triglycerides: 182 mg/dL — ABNORMAL HIGH (ref 0–149)
VLDL Cholesterol Cal: 32 mg/dL (ref 5–40)

## 2023-05-28 LAB — HEMOGLOBIN A1C
Est. average glucose Bld gHb Est-mCnc: 117 mg/dL
Hgb A1c MFr Bld: 5.7 % — ABNORMAL HIGH (ref 4.8–5.6)

## 2023-06-14 ENCOUNTER — Ambulatory Visit (INDEPENDENT_AMBULATORY_CARE_PROVIDER_SITE_OTHER): Payer: HMO | Admitting: Nurse Practitioner

## 2023-06-14 ENCOUNTER — Encounter: Payer: Self-pay | Admitting: Nurse Practitioner

## 2023-06-14 VITALS — BP 126/70 | HR 69 | Ht 64.0 in | Wt 156.0 lb

## 2023-06-14 DIAGNOSIS — Z01419 Encounter for gynecological examination (general) (routine) without abnormal findings: Secondary | ICD-10-CM

## 2023-06-14 DIAGNOSIS — M8589 Other specified disorders of bone density and structure, multiple sites: Secondary | ICD-10-CM

## 2023-06-14 DIAGNOSIS — Z9189 Other specified personal risk factors, not elsewhere classified: Secondary | ICD-10-CM | POA: Diagnosis not present

## 2023-06-14 DIAGNOSIS — Z780 Asymptomatic menopausal state: Secondary | ICD-10-CM

## 2023-06-14 NOTE — Addendum Note (Signed)
Addended byWyline Beady on: 06/14/2023 11:56 AM   Modules accepted: Level of Service

## 2023-06-14 NOTE — Progress Notes (Addendum)
   Katrina Simpson [birth year] [id number]   History:  67 y.o. G3P0004 presents for breast and pelvic exam without GYN complaints. Postmenopausal - no HRT, no bleeding. 1996 LGSIL, subsequent paps normal. Normal mammogram history. HTN, HLD managed by PCP.   Gynecologic History Patient's last menstrual period was 12/28/1998.   Contraception/Family planning: post menopausal status Sexually active: Yes  Health Maintenance Last Pap: 04/17/2020. Results were: Normal neg HPV Last mammogram: 07/29/2022. Results were: Left breast asymmetry, F/U imaging negative Last colonoscopy: 04/2018. Results: Normal. 5-year recall Last Dexa: 11/19/2020. Results were: T-score -2.1, FRAX 11% / 1.9%  Past medical history, past surgical history, family history and social history were all reviewed and documented in the EPIC chart. Married. Retired December 2023. 4 sons, 5 grandchildren age 96-69 yo.   ROS:  A ROS was performed and pertinent positives and negatives are included.  Exam:  Vitals:   06/14/23 1133  BP: 126/70  Pulse: 69  SpO2: 100%  Weight: 156 lb (70.8 kg)  Height: 5\' 4"  (1.626 m)     Body mass index is 26.78 kg/m.  General appearance:  Normal Thyroid:  Symmetrical, normal in size, without palpable masses or nodularity. Respiratory  Auscultation:  Clear without wheezing or rhonchi Cardiovascular  Auscultation:  Regular rate, without rubs, murmurs or gallops  Edema/varicosities:  Not grossly evident Abdominal  Soft,nontender, without masses, guarding or rebound.  Liver/spleen:  No organomegaly noted  Hernia:  None appreciated  Skin  Inspection:  Grossly normal Breasts: Examined lying and sitting.   Right: Without masses, retractions, nipple discharge or axillary adenopathy.   Left: Without masses, retractions, nipple discharge or axillary adenopathy. Gentitourinary   Inguinal/mons:  Normal without inguinal adenopathy  External genitalia:  Scattered sebaceous cysts on both labia  majoras  BUS/Urethra/Skene's glands:  Normal  Vagina:  Normal appearing with normal color and discharge, no lesions. Atrophic changes  Cervix:  Normal appearing without discharge or lesions  Uterus:  Normal in size, shape and contour.  Midline and mobile, nontender  Adnexa/parametria:     Rt: Normal in size, without masses or tenderness.   Lt: Normal in size, without masses or tenderness.  Anus and perineum: Normal  Digital rectal exam: Deferred  Patient informed chaperone available to be present for breast and pelvic exam. Patient has requested no chaperone to be present. Patient has been advised what will be completed during breast and pelvic exam.   Assessment/Plan:  67 y.o. G3P0004 for breast and pelvic exam.   Encounter for breast and pelvic examination - Education provided on SBEs, importance of preventative screenings, current guidelines, high calcium diet, regular exercise, and multivitamin daily. Labs with PCP.   Postmenopausal - no HRT, no bleeding  Osteopenia of multiple sites - Plan: DG Bone Density. 10/2020 T-score -2.1. Continue daily Vitamin D supplement and increase exercise. Recommend repeating DXA now. Plans to check with PCP office since closer to home. Will call if she decides to schedule here. Aware we will not have bone density screenings after 7/12.   Screening for cervical cancer - 1976 LGSIL. Discussed option to stop screenings. Considering doing one more pap at 5-year interval then stopping.   Screening for breast cancer - Normal mammogram history.  Continue annual screenings. Normal breast exam today.  Screening for colon cancer - 2019 colonoscopy. Received letter and plans to schedule soon.   Return in 1 year for breast and pelvic exam.     Olivia Mackie DNP, 11:56 AM 06/14/2023

## 2023-07-06 ENCOUNTER — Ambulatory Visit (INDEPENDENT_AMBULATORY_CARE_PROVIDER_SITE_OTHER): Payer: HMO

## 2023-07-06 ENCOUNTER — Other Ambulatory Visit: Payer: Self-pay | Admitting: Nurse Practitioner

## 2023-07-06 DIAGNOSIS — Z780 Asymptomatic menopausal state: Secondary | ICD-10-CM

## 2023-07-06 DIAGNOSIS — Z01419 Encounter for gynecological examination (general) (routine) without abnormal findings: Secondary | ICD-10-CM

## 2023-07-06 DIAGNOSIS — Z13820 Encounter for screening for osteoporosis: Secondary | ICD-10-CM

## 2023-07-06 DIAGNOSIS — Z9189 Other specified personal risk factors, not elsewhere classified: Secondary | ICD-10-CM

## 2023-07-06 DIAGNOSIS — M8589 Other specified disorders of bone density and structure, multiple sites: Secondary | ICD-10-CM

## 2023-07-20 ENCOUNTER — Ambulatory Visit (INDEPENDENT_AMBULATORY_CARE_PROVIDER_SITE_OTHER): Payer: HMO | Admitting: Nurse Practitioner

## 2023-07-20 VITALS — BP 122/74 | HR 79 | Wt 156.0 lb

## 2023-07-20 DIAGNOSIS — M8589 Other specified disorders of bone density and structure, multiple sites: Secondary | ICD-10-CM | POA: Diagnosis not present

## 2023-07-20 DIAGNOSIS — Z9189 Other specified personal risk factors, not elsewhere classified: Secondary | ICD-10-CM | POA: Diagnosis not present

## 2023-07-20 MED ORDER — ALENDRONATE SODIUM 70 MG PO TABS
70.0000 mg | ORAL_TABLET | ORAL | 3 refills | Status: AC
Start: 2023-07-20 — End: ?

## 2023-07-20 NOTE — Progress Notes (Signed)
   Acute Office Visit  Subjective:    Patient ID: Katrina Simpson, female    DOB: [birth year], 67 y.o.   MRN: [id number]   HPI 67 y.o. presents today to discuss DXA performed 07/06/23. T-score -2.2 with elevated overall fracture risk of 21% and hip risk of 4.0%. All sites osteopenic. Has not been consistent with Vit D supplement. Walks daily with dog and does lots of yard work. Stopped Prilosec because she read online it can cause bone loss.   Patient's last menstrual period was 12/28/1998.    Review of Systems  Constitutional: Negative.        Objective:    Physical Exam Constitutional:      Appearance: Normal appearance.     BP 122/74   Pulse 79   Wt 156 lb (70.8 kg)   LMP 12/28/1998   SpO2 100%   BMI 26.78 kg/m  Wt Readings from Last 3 Encounters:  07/20/23 156 lb (70.8 kg)  06/14/23 156 lb (70.8 kg)  05/27/23 161 lb 4.8 oz (73.2 kg)         Assessment & Plan:   Problem List Items Addressed This Visit   None Visit Diagnoses     Fracture Risk Assessment Score (FRAX) indicating greater than 20% risk for major osteoporosis-related fracture    -  Primary   Relevant Medications   alendronate (FOSAMAX) 70 MG tablet   Fracture Risk Assessment Score (FRAX) indicating greater than 3% risk for hip fracture       Relevant Medications   alendronate (FOSAMAX) 70 MG tablet   Osteopenia of multiple sites       Relevant Medications   alendronate (FOSAMAX) 70 MG tablet      Plan: Reviewed DXA report and elevated FRAX. Discussed management options to optimize Vit D, calcium and exercise and/or started medication management with bisphosphonates. Educated on MOA, routes and possible side effects of bisphosphonates. She would like to start medication management. Will also restart Vit D supplement, increase calcium in her diet and exercise regularly making sure to incorporate resistance training. Will repeat DXA in 2 years.      Olivia Mackie DNP, 9:21 AM 07/20/2023

## 2023-08-04 DIAGNOSIS — H25813 Combined forms of age-related cataract, bilateral: Secondary | ICD-10-CM | POA: Diagnosis not present

## 2023-08-04 DIAGNOSIS — H524 Presbyopia: Secondary | ICD-10-CM | POA: Diagnosis not present

## 2023-08-04 DIAGNOSIS — H52223 Regular astigmatism, bilateral: Secondary | ICD-10-CM | POA: Diagnosis not present

## 2023-10-12 DIAGNOSIS — Z01818 Encounter for other preprocedural examination: Secondary | ICD-10-CM | POA: Diagnosis not present

## 2023-10-12 DIAGNOSIS — Z860100 Personal history of colon polyps, unspecified: Secondary | ICD-10-CM | POA: Diagnosis not present

## 2023-11-04 DIAGNOSIS — D225 Melanocytic nevi of trunk: Secondary | ICD-10-CM | POA: Diagnosis not present

## 2023-11-04 DIAGNOSIS — D1801 Hemangioma of skin and subcutaneous tissue: Secondary | ICD-10-CM | POA: Diagnosis not present

## 2023-11-04 DIAGNOSIS — L820 Inflamed seborrheic keratosis: Secondary | ICD-10-CM | POA: Diagnosis not present

## 2023-11-04 DIAGNOSIS — L918 Other hypertrophic disorders of the skin: Secondary | ICD-10-CM | POA: Diagnosis not present

## 2023-11-04 DIAGNOSIS — D2261 Melanocytic nevi of right upper limb, including shoulder: Secondary | ICD-10-CM | POA: Diagnosis not present

## 2023-11-04 DIAGNOSIS — L720 Epidermal cyst: Secondary | ICD-10-CM | POA: Diagnosis not present

## 2023-11-04 DIAGNOSIS — L821 Other seborrheic keratosis: Secondary | ICD-10-CM | POA: Diagnosis not present

## 2023-11-04 DIAGNOSIS — D2271 Melanocytic nevi of right lower limb, including hip: Secondary | ICD-10-CM | POA: Diagnosis not present

## 2023-11-04 DIAGNOSIS — D2262 Melanocytic nevi of left upper limb, including shoulder: Secondary | ICD-10-CM | POA: Diagnosis not present

## 2023-11-04 DIAGNOSIS — D2272 Melanocytic nevi of left lower limb, including hip: Secondary | ICD-10-CM | POA: Diagnosis not present

## 2023-11-04 DIAGNOSIS — L738 Other specified follicular disorders: Secondary | ICD-10-CM | POA: Diagnosis not present

## 2023-11-30 ENCOUNTER — Ambulatory Visit (INDEPENDENT_AMBULATORY_CARE_PROVIDER_SITE_OTHER): Payer: HMO | Admitting: Family Medicine

## 2023-11-30 ENCOUNTER — Encounter: Payer: Self-pay | Admitting: Family Medicine

## 2023-11-30 VITALS — BP 124/78 | HR 68 | Resp 16 | Ht 64.0 in | Wt 166.1 lb

## 2023-11-30 DIAGNOSIS — Z0000 Encounter for general adult medical examination without abnormal findings: Secondary | ICD-10-CM | POA: Diagnosis not present

## 2023-11-30 DIAGNOSIS — Z1231 Encounter for screening mammogram for malignant neoplasm of breast: Secondary | ICD-10-CM

## 2023-11-30 NOTE — Progress Notes (Signed)
Medicare Initial Preventative Physical Exam    Patient: Katrina Simpson, Female    DOB: 10/09/1956, 67 y.o.   MRN: [id number] Visit Date: 11/30/2023  Today's Provider: Shirlee Latch, MD   Chief Complaint  Patient presents with   Medicare Wellness   Annual Exam   Subjective    Medicare Initial Preventative Physical Exam Katrina Simpson is a 67 y.o. female who presents today for her Initial Preventative Physical Exam.   HPI  Discussed the use of AI scribe software for clinical note transcription with the patient, who gave verbal consent to proceed.  History of Present Illness   The patient, with a history of hypertension, presents for a "Welcome to Medicare" physical. They report pain in the hand, specifically at the base of the thumb, which is worse after a long day of using their hands or when wearing tight shoes. The patient also reports pain in the toe, which is worse when standing for long periods or wearing tight shoes. The patient denies any difficulty with self-care, hearing, seeing, dressing, or bathing. They have not had any falls in the past year and do not feel depressed. Their memory test was perfect with a score of zero. The patient has not received a flu shot this year, choosing to forgo it to see how they fare without it. They have received their pneumonia and shingles shots and are not due for another tetanus shot until 2031. The patient's bone density scan was done in July and they are due for a mammogram and colonoscopy, which have been postponed due to the birth of a grandchild.       Social History   Socioeconomic History   Marital status: Married    Spouse name: Not on file   Number of children: Not on file   Years of education: Not on file   Highest education level: Not on file  Occupational History   Not on file  Tobacco Use   Smoking status: Never   Smokeless tobacco: Never  Vaping Use   Vaping status: Never Used  Substance and Sexual Activity    Alcohol use: No   Drug use: No   Sexual activity: Yes    Birth control/protection: Post-menopausal    Comment: >5 sexual partners  Other Topics Concern   Not on file  Social History Narrative   Not on file   Social Determinants of Health   Financial Resource Strain: Low Risk  (11/30/2023)   Overall Financial Resource Strain (CARDIA)    Difficulty of Paying Living Expenses: Not hard at all  Food Insecurity: No Food Insecurity (11/30/2023)   Hunger Vital Sign    Worried About Running Out of Food in the Last Year: Never true    Ran Out of Food in the Last Year: Never true  Transportation Needs: No Transportation Needs (11/30/2023)   PRAPARE - Administrator, Civil Service (Medical): No    Lack of Transportation (Non-Medical): No  Physical Activity: Unknown (11/30/2023)   Exercise Vital Sign    Days of Exercise per Week: 0 days    Minutes of Exercise per Session: Not on file  Stress: No Stress Concern Present (11/30/2023)   [employer] of Occupational Health - Occupational Stress Questionnaire    Feeling of Stress : Not at all  Social Connections: Socially Integrated (11/30/2023)   Social Connection and Isolation Panel [NHANES]    Frequency of Communication with Friends and Family: More than three times a week  Frequency of Social Gatherings with Friends and Family: Three times a week    Attends Religious Services: More than 4 times per year    Active Member of Clubs or Organizations: Yes    Attends Banker Meetings: More than 4 times per year    Marital Status: Married  Catering manager Violence: Not At Risk (11/30/2023)   Humiliation, Afraid, Rape, and Kick questionnaire    Fear of Current or Ex-Partner: No    Emotionally Abused: No    Physically Abused: No    Sexually Abused: No    Past Medical History:  Diagnosis Date   Arthritis    GERD (gastroesophageal reflux disease)    Headache    EVERY DAY HEADACHES   Hepatitis    Hyperlipidemia     Hypertension    Insomnia    Osteopenia 04/2017   T score -2.1 FRAX 4.6%/0.6%     Patient Active Problem List   Diagnosis Date Noted   Overweight 05/27/2023   Insomnia 03/18/2020   Osteopenia after menopause 04/17/2019   Bilateral carpal tunnel syndrome 02/03/2019   Primary osteoarthritis of both first carpometacarpal joints 02/03/2019   Chronic headache 05/12/2016   Hyperlipidemia 02/25/2016   Essential hypertension, benign 06/15/2013    Past Surgical History:  Procedure Laterality Date   CESAREAN SECTION     X3   COLONOSCOPY N/A 05/03/2015   Procedure: COLONOSCOPY;  Surgeon: Scot Jun, MD;  Location: [facility] ENDOSCOPY;  Service: Endoscopy;  Laterality: N/A;   TOTAL KNEE ARTHROPLASTY Right 06/24/2021   Procedure: TOTAL KNEE ARTHROPLASTY;  Surgeon: Christena Flake, MD;  Location: [facility] ORS;  Service: Orthopedics;  Laterality: Right;   TUBAL LIGATION      Her family history includes Healthy in her brother; Hypertension (age of onset: 61) in her mother; Other in her son; Stroke (age of onset: 58) in her father. There is no history of Colon cancer or Breast cancer.   Current Outpatient Medications:    alendronate (FOSAMAX) 70 MG tablet, Take 1 tablet (70 mg total) by mouth every 7 (seven) days. Take with a full glass of water on an empty stomach., Disp: 12 tablet, Rfl: 3   chlorthalidone (HYGROTON) 25 MG tablet, Take 1 tablet (25 mg total) by mouth daily., Disp: 90 tablet, Rfl: 1   nortriptyline (PAMELOR) 50 MG capsule, Take 1 capsule (50 mg total) by mouth at bedtime., Disp: 90 capsule, Rfl: 1   polyethylene glycol (MIRALAX / GLYCOLAX) 17 g packet, Take 17 g by mouth daily., Disp: , Rfl:    rosuvastatin (CRESTOR) 5 MG tablet, Take 1 tablet (5 mg total) by mouth daily., Disp: 90 tablet, Rfl: 3   Patient Care Team: Erasmo Downer, MD as PCP - General (Family Medicine) Olivia Mackie, NP as Nurse Practitioner (Gynecology)  Review of Systems     Objective     Vitals: BP 124/78 (BP Location: Left Arm, Patient Position: Sitting, Cuff Size: Normal)   Pulse 68   Resp 16   Ht 5\' 4"  (1.626 m)   Wt 166 lb 1.6 oz (75.3 kg)   LMP 12/28/1998   BMI 28.51 kg/m  No results found. Physical Exam Vitals reviewed.  Constitutional:      General: She is not in acute distress.    Appearance: Normal appearance. She is well-developed. She is not diaphoretic.  HENT:     Head: Normocephalic and atraumatic.     Right Ear: Tympanic membrane, ear canal and external ear normal.  Left Ear: Tympanic membrane, ear canal and external ear normal.     Nose: Nose normal.     Mouth/Throat:     Mouth: Mucous membranes are moist.     Pharynx: Oropharynx is clear. No oropharyngeal exudate.  Eyes:     General: No scleral icterus.    Conjunctiva/sclera: Conjunctivae normal.     Pupils: Pupils are equal, round, and reactive to light.  Neck:     Thyroid: No thyromegaly.  Cardiovascular:     Rate and Rhythm: Normal rate and regular rhythm.     Heart sounds: Normal heart sounds. No murmur heard. Pulmonary:     Effort: Pulmonary effort is normal. No respiratory distress.     Breath sounds: Normal breath sounds. No wheezing or rales.  Abdominal:     General: There is no distension.     Palpations: Abdomen is soft.     Tenderness: There is no abdominal tenderness.  Musculoskeletal:        General: No deformity.     Cervical back: Neck supple.     Right lower leg: No edema.     Left lower leg: No edema.  Lymphadenopathy:     Cervical: No cervical adenopathy.  Skin:    General: Skin is warm and dry.     Findings: No rash.  Neurological:     Mental Status: She is alert and oriented to person, place, and time. Mental status is at baseline.     Gait: Gait normal.  Psychiatric:        Mood and Affect: Mood normal.        Behavior: Behavior normal.        Thought Content: Thought content normal.      Activities of Daily Living    11/30/2023    3:29 PM  05/27/2023    8:25 AM  In your present state of health, do you have any difficulty performing the following activities:  Hearing? 0 0  Vision? 0 0  Difficulty concentrating or making decisions?  0  Walking or climbing stairs? 0 0  Dressing or bathing? 0 0  Doing errands, shopping? 0 0    Fall Risk Assessment    11/30/2023    3:21 PM 05/27/2023    8:24 AM 11/23/2022    3:56 PM 05/18/2022    4:02 PM 10/23/2021    3:46 PM  Fall Risk   Falls in the past year? 0 0 0 0 1  Number falls in past yr: 0 0 0 0 0  Injury with Fall? 0 0 0 0 1  Risk for fall due to : No Fall Risks No Fall Risks No Fall Risks No Fall Risks History of fall(s)  Follow up    Falls evaluation completed Falls evaluation completed;Education provided;Falls prevention discussed     Depression Screen    11/30/2023    3:21 PM 05/27/2023    8:24 AM 11/23/2022    3:56 PM 05/18/2022    4:02 PM  PHQ 2/9 Scores  PHQ - 2 Score 0 0 0 0  PHQ- 9 Score   0 0       11/30/2023    3:28 PM  6CIT Screen  What Year? 0 points  What month? 0 points  What time? 0 points  Count back from 20 0 points  Months in reverse 0 points  Repeat phrase 0 points  Total Score 0 points    No results found for any visits on 11/30/23.  Assessment &  Plan      Initial Preventative Physical Exam  Reviewed patient's Family Medical History Reviewed and updated list of patient's medical providers Assessment of cognitive impairment was done Assessed patient's functional ability Established a written schedule for health screening services Health Risk Assessent Completed and Reviewed  Exercise Activities and Dietary recommendations  Goals   None     Immunization History  Administered Date(s) Administered   Fluad Quad(high Dose 65+) 09/23/2020, 09/24/2021, 10/21/2022   Influenza,inj,Quad PF,6+ Mos 10/17/2016, 10/02/2017, 10/08/2018, 08/31/2019   Influenza-Unspecified 10/20/2013, 09/28/2015   PFIZER(Purple Top)SARS-COV-2 Vaccination  01/12/2020, 02/02/2020   PNEUMOCOCCAL CONJUGATE-20 05/18/2022   Pneumococcal Conjugate-13 03/18/2020   Tdap 03/18/2020   Zoster Recombinant(Shingrix) 03/18/2020, 11/28/2020    Health Maintenance  Topic Date Due   COVID-19 Vaccine (3 - 2023-24 season) 08/29/2023   INFLUENZA VACCINE  03/27/2024 (Originally 07/29/2023)   MAMMOGRAM  07/29/2024   Medicare Annual Wellness (AWV)  11/29/2024   Colonoscopy  05/02/2025   DTaP/Tdap/Td (2 - Td or Tdap) 03/18/2030   Pneumonia Vaccine 57+ Years old  Completed   DEXA SCAN  Completed   Hepatitis C Screening  Completed   Zoster Vaccines- Shingrix  Completed   HPV VACCINES  Aged Out     Discussed health benefits of physical activity, and encouraged her to engage in regular exercise appropriate for her age and condition.   Problem List Items Addressed This Visit   None Visit Diagnoses     Welcome to Medicare preventive visit    -  Primary   Relevant Orders   EKG 12-Lead   Breast cancer screening by mammogram       Relevant Orders   MM 3D SCREENING MAMMOGRAM BILATERAL BREAST           Welcome to Medicare Visit First physical since enrolling in Medicare Part B in May. Comprehensive review of wellness and preventive care measures. EKG performed to establish baseline. - Perform EKG - Review wellness questionnaire results - Schedule annual physical and Medicare wellness visit  Arthritis of the [facility] Joint Pain and swelling at the base of the right thumb, consistent with [facility] joint arthritis. Using topical creams for relief. Voltaren gel and icing recommended. - Recommend Voltaren gel - Advise icing after extensive use  Toe Pain Pain in the toe, particularly when standing for long periods or wearing tight shoes. Misalignment causing discomfort. Wide toe box shoes, padding, and Voltaren gel suggested. - Recommend wide toe box shoes - Suggest toe padding with cotton or gauze - Advise using Voltaren gel  General Health  Maintenance Up-to-date with preventive care. Received pneumonia and shingles vaccinations. Bone density scan in July. Skipping flu shot due to mild season. Due for mammogram and colonoscopy. Discussed RSV vaccine due to age and new grandbaby. - Order mammogram - Schedule mammogram for January or February - Remind to schedule colonoscopy in January - Recommend RSV vaccine at pharmacy  Follow-up - Schedule follow-up visit in six months for blood pressure and routine care.        Return in about 6 months (around 05/30/2024) for chronic disease f/u.      Shirlee Latch, MD  [facility] [phone] (phone) [phone] (fax)  [facility]

## 2023-12-04 ENCOUNTER — Other Ambulatory Visit: Payer: Self-pay | Admitting: Family Medicine

## 2023-12-04 DIAGNOSIS — G4700 Insomnia, unspecified: Secondary | ICD-10-CM

## 2023-12-04 DIAGNOSIS — I10 Essential (primary) hypertension: Secondary | ICD-10-CM

## 2024-01-10 ENCOUNTER — Ambulatory Visit (INDEPENDENT_AMBULATORY_CARE_PROVIDER_SITE_OTHER): Payer: HMO | Admitting: Family Medicine

## 2024-01-10 VITALS — BP 120/78 | HR 83 | Temp 98.9°F | Ht 64.0 in | Wt 166.1 lb

## 2024-01-10 DIAGNOSIS — J0140 Acute pansinusitis, unspecified: Secondary | ICD-10-CM

## 2024-01-10 MED ORDER — DOXYCYCLINE HYCLATE 100 MG PO TABS: 100.0000 mg | ORAL_TABLET | Freq: Two times a day (BID) | ORAL | 0 refills | Status: AC

## 2024-01-10 NOTE — Progress Notes (Signed)
 Acute visit   Patient: Katrina Simpson   DOB: 06-12-1956   68 y.o. Female  MRN: [id number]  Chief Complaint  Patient presents with   Cough    Productive cough with yellowish green phlegm X 1 week. She reports it is about the same since it begin. Believes she has a sinus infection. Associated with fever and body aches. She reports taking tylenol  and robitussin DM at night for symptoms. She has not physically checked temperature, took two tylenol  around 6:30 am.    Subjective    Discussed the use of AI scribe software for clinical note transcription with the patient, who gave verbal consent to proceed.  History of Present Illness   The patient, with a history of penicillin allergy, presents with a week-long history of upper respiratory symptoms. Initially, she experienced a dry cough which progressed to a productive cough with 'a big glob' of mucus. She also developed a fever on the fourth day of illness, which resolved and then recurred. Accompanying symptoms include sinus congestion, pressure, and significant postnasal drainage. The patient also reports generalized aches and weakness, severe enough to require assistance from her spouse. She has been managing symptoms with over-the-counter Tylenol  and Robitussin DM.        Review of Systems  Objective    BP 120/78 (BP Location: Left Arm, Patient Position: Sitting, Cuff Size: Normal)   Pulse 83   Temp 98.9 F (37.2 C)   Ht 5' 4 (1.626 m)   Wt 166 lb 1.6 oz (75.3 kg)   LMP 12/28/1998   SpO2 98%   BMI 28.51 kg/m  Physical Exam Vitals reviewed.  Constitutional:      General: She is not in acute distress.    Appearance: Normal appearance. She is well-developed. She is not diaphoretic.  HENT:     Head: Normocephalic and atraumatic.     Right Ear: Tympanic membrane, ear canal and external ear normal.     Left Ear: Tympanic membrane, ear canal and external ear normal.     Nose: Congestion present.     Mouth/Throat:      Mouth: Mucous membranes are moist.     Pharynx: Oropharynx is clear. No oropharyngeal exudate.  Eyes:     General: No scleral icterus.    Conjunctiva/sclera: Conjunctivae normal.     Pupils: Pupils are equal, round, and reactive to light.  Cardiovascular:     Rate and Rhythm: Normal rate and regular rhythm.     Heart sounds: Normal heart sounds. No murmur heard. Pulmonary:     Effort: Pulmonary effort is normal. No respiratory distress.     Breath sounds: Normal breath sounds. No wheezing or rales.  Musculoskeletal:     Cervical back: Neck supple.     Right lower leg: No edema.     Left lower leg: No edema.  Lymphadenopathy:     Cervical: No cervical adenopathy.  Skin:    General: Skin is warm and dry.     Findings: No rash.  Neurological:     Mental Status: She is alert.       No results found for any visits on 01/10/24.  Assessment & Plan     Problem List Items Addressed This Visit   None Visit Diagnoses       Acute non-recurrent pansinusitis    -  Primary   Relevant Medications   doxycycline  (VIBRA -TABS) 100 MG tablet  Acute Sinusitis Acute sinusitis with one-week duration, characterized by mucous cough, sinus congestion, and drainage. Fever developed later in the course. Physical exam reveals significant nasal inflammation. Likely bacterial etiology given duration and symptoms. Doxycycline  is appropriate given penicillin allergy. Afrin should be used for no more than 2-3 days to avoid rebound congestion. Emphasized completing the full course of antibiotics even if symptoms improve. Honey has been shown effective for cough relief in studies. - Prescribe doxycycline  100 mg twice daily for 7 days - Recommend nasal saline or Flonase for symptomatic relief - Advise using Afrin for no more than 2-3 days - Recommend Robitussin DEM for nighttime cough relief - Suggest using honey for cough relief (honey cough drops, honey in tea, etc.) - Send doxycycline   prescription to [facility] in [place].        Meds ordered this encounter  Medications   doxycycline  (VIBRA -TABS) 100 MG tablet    Sig: Take 1 tablet (100 mg total) by mouth 2 (two) times daily for 7 days.    Dispense:  14 tablet    Refill:  0     Return if symptoms worsen or fail to improve.      Jon Eva, MD  [facility] [phone] (phone) [phone] (fax)  [facility]

## 2024-02-02 ENCOUNTER — Encounter: Payer: Self-pay | Admitting: Family Medicine

## 2024-02-02 ENCOUNTER — Ambulatory Visit: Payer: HMO | Admitting: Family Medicine

## 2024-02-02 VITALS — BP 107/64 | HR 87 | Resp 20 | Ht 64.0 in | Wt 166.9 lb

## 2024-02-02 DIAGNOSIS — R051 Acute cough: Secondary | ICD-10-CM

## 2024-02-02 LAB — POC COVID19/FLU A&B COMBO
Covid Antigen, POC: NEGATIVE
Influenza A Antigen, POC: POSITIVE — AB
Influenza B Antigen, POC: NEGATIVE

## 2024-02-02 MED ORDER — OSELTAMIVIR PHOSPHATE 75 MG PO CAPS
75.0000 mg | ORAL_CAPSULE | Freq: Two times a day (BID) | ORAL | 0 refills | Status: AC
Start: 2024-02-02 — End: 2024-02-07

## 2024-02-02 MED ORDER — PROMETHAZINE-DM 6.25-15 MG/5ML PO SYRP: 5.0000 mL | ORAL_SOLUTION | Freq: Every evening | ORAL | 0 refills | Status: DC | PRN

## 2024-02-02 NOTE — Patient Instructions (Signed)
   YOUR PLAN:  -INFLUENZA A: Influenza A is a viral infection that affects the respiratory system, causing symptoms such as body aches, fevers, chills, sneezing, and a dry cough. You have been prescribed Tamiflu  75 mg to be taken twice daily for 5 days to help reduce the severity and duration of the flu. It is important to get plenty of rest and manage your symptoms with over-the-counter medications as needed. Hand hygiene and surface disinfection are crucial to prevent spreading the virus to others. Drinking hot tea with honey can help soothe your cough. If you experience shortness of breath, inability to take a deep breath, lightheadedness, or dizziness, seek emergency care immediately.  INSTRUCTIONS:  Please take Tamiflu  75 mg twice daily for 5 days. Ensure you get plenty of rest and use over-the-counter medications to manage your symptoms. Practice good hand hygiene and disinfect surfaces regularly to prevent spreading the virus. Drink hot tea with honey to help relieve your cough. If you experience shortness of breath, inability to take a deep breath, lightheadedness, or dizziness, seek emergency care immediately.

## 2024-02-02 NOTE — Progress Notes (Signed)
 Established patient visit   Patient: Katrina Simpson   DOB: 09-17-1956   68 y.o. Female  MRN: [id number] Visit Date: 02/02/2024  Today's healthcare provider: Rockie Agent, MD   Chief Complaint  Patient presents with   Cough    Previous sinus infection was mostly resolved, but yesterday symptoms listed worsened   Fever    None measured, but has felt feverish and advil relieves symptoms   Generalized Body Aches        Subjective     HPI     Cough    Additional comments: Previous sinus infection was mostly resolved, but yesterday symptoms listed worsened        Fever    Additional comments: None measured, but has felt feverish and advil relieves symptoms        Generalized Body Aches    Additional comments:        Last edited by Anette Alan CROME, CMA on 02/02/2024  1:49 PM.       Discussed the use of AI scribe software for clinical note transcription with the patient, who gave verbal consent to proceed.  History of Present Illness   Katrina Simpson is a 68 year old female who presents with symptoms following a recent sinus infection treatment.  She was treated on January 13th for a sinus infection with doxycycline . After completing the antibiotic course, she developed new symptoms including body aches, fevers, chills, and sneezing. These symptoms began to worsen, particularly noting an increase in coughing since Monday, which became more severe by Tuesday.  Initially, the cough was slightly productive but has since transitioned to a dry cough. No shortness of breath, wheezing, ear pain, facial pressure, or sore throat. She has been managing her symptoms with over-the-counter medications, specifically mentioning the use of Tylenol  and cough syrup.  Her son and daughter-in-law, who recently had a baby, have been sick, and she has been helping with the baby, although the baby has not shown signs of illness.         Past Medical History:  Diagnosis  Date   Arthritis    GERD (gastroesophageal reflux disease)    Headache    EVERY DAY HEADACHES   Hepatitis    Hyperlipidemia    Hypertension    Insomnia    Osteopenia 04/2017   T score -2.1 FRAX 4.6%/0.6%    Medications: Outpatient Medications Prior to Visit  Medication Sig   alendronate  (FOSAMAX ) 70 MG tablet Take 1 tablet (70 mg total) by mouth every 7 (seven) days. Take with a full glass of water on an empty stomach.   chlorthalidone  (HYGROTON ) 25 MG tablet TAKE 1 TABLET (25 MG TOTAL) BY MOUTH DAILY.   nortriptyline  (PAMELOR ) 50 MG capsule TAKE 1 CAPSULE BY MOUTH AT BEDTIME.   polyethylene glycol (MIRALAX / GLYCOLAX) 17 g packet Take 17 g by mouth daily.   rosuvastatin  (CRESTOR ) 5 MG tablet Take 1 tablet (5 mg total) by mouth daily.   No facility-administered medications prior to visit.    Review of Systems  Last CBC Lab Results  Component Value Date   WBC 8.1 06/16/2021   HGB 14.6 06/24/2021   HCT 43.0 06/24/2021   MCV 89.1 06/16/2021   MCH 31.5 06/16/2021   RDW 12.5 06/16/2021   PLT 226 06/16/2021        Objective    BP 107/64   Pulse 87   Resp 20   Ht 5' 4 (1.626 m)  Wt 166 lb 14.4 oz (75.7 kg)   LMP 12/28/1998   SpO2 98%   BMI 28.65 kg/m  BP Readings from Last 3 Encounters:  02/02/24 107/64  01/10/24 120/78  11/30/23 124/78   Wt Readings from Last 3 Encounters:  02/02/24 166 lb 14.4 oz (75.7 kg)  01/10/24 166 lb 1.6 oz (75.3 kg)  11/30/23 166 lb 1.6 oz (75.3 kg)        Physical Exam  Physical Exam   HEENT: Pharynx normal, no exudate, no inflammation. No lymphadenopathy. CHEST: Lungs clear to auscultation, no crackles, no wheezing.       Results for orders placed or performed in visit on 02/02/24  POC Covid19/Flu A&B Antigen  Result Value Ref Range   Influenza A Antigen, POC Positive (A) Negative   Influenza B Antigen, POC Negative Negative   Covid Antigen, POC Negative Negative    Assessment & Plan     Problem List Items  Addressed This Visit   None Visit Diagnoses       Acute cough    -  Primary   Relevant Medications   oseltamivir  (TAMIFLU ) 75 MG capsule   promethazine -dextromethorphan (PROMETHAZINE -DM) 6.25-15 MG/5ML syrup   Other Relevant Orders   POC Covid19/Flu A&B Antigen (Completed)          Influenza A Positive for Influenza A. Symptoms include body aches, fevers, chills, sneezing, and a dry cough. No shortness of breath, wheezing, ear pain, or facial congestion. Physical exam reveals no crackles or wheezing in the lungs, no lymphadenopathy, and no significant nasal tenderness. Contact with sick family members noted. Discussed treatment options including Tamiflu , rest, and symptomatic treatment. Emphasized hand hygiene and surface disinfection to prevent spread. Advised on hot tea with honey for cough relief. Instructed to seek emergency care if experiencing shortness of breath, inability to take a deep breath, lightheadedness, or dizziness. - Prescribe Tamiflu  75 mg twice daily for 5 days -prescribed promethazine  DM 6.25-15mg  at bedtime PRN for cough  - Recommend rest and symptomatic treatment - Advise hand hygiene and surface disinfection - Suggest hot tea with honey for cough relief - Prescribe cough medicine - Instruct to seek emergency care if experiencing shortness of breath, inability to take a deep breath, lightheadedness, or dizziness.         Return in about 1 week (around 02/09/2024), or if symptoms worsen or fail to improve.         Rockie Agent, MD  [facility] [phone] (phone) [phone] (fax)  [facility]

## 2024-03-06 ENCOUNTER — Other Ambulatory Visit: Payer: Self-pay | Admitting: Family Medicine

## 2024-03-06 DIAGNOSIS — L821 Other seborrheic keratosis: Secondary | ICD-10-CM | POA: Diagnosis not present

## 2024-03-06 DIAGNOSIS — G4700 Insomnia, unspecified: Secondary | ICD-10-CM

## 2024-03-06 DIAGNOSIS — I10 Essential (primary) hypertension: Secondary | ICD-10-CM

## 2024-03-06 DIAGNOSIS — L649 Androgenic alopecia, unspecified: Secondary | ICD-10-CM | POA: Diagnosis not present

## 2024-03-07 NOTE — Telephone Encounter (Signed)
 Both- 12/06/23 #90 1RF- too soon Requested Prescriptions  Pending Prescriptions Disp Refills   nortriptyline (PAMELOR) 50 MG capsule [Pharmacy Med Name: NORTRIPTYLINE HCL 50 MG CAP] 90 capsule 1    Sig: TAKE 1 CAPSULE BY MOUTH EVERYDAY AT BEDTIME     Psychiatry:  Antidepressants - Heterocyclics (TCAs) Passed - 03/07/2024  2:39 PM      Passed - Valid encounter within last 6 months    Recent Outpatient Visits           1 month ago Acute non-recurrent pansinusitis   [place] [facility] [place], Marzella Schlein, MD   3 months ago Welcome to [employer] preventive visit   [place] [facility] [place], Marzella Schlein, MD   9 months ago Essential hypertension, benign   [place] [facility] [place], Marzella Schlein, MD   1 year ago Encounter for annual physical exam   [place] [facility], Marzella Schlein, MD   1 year ago Essential hypertension, benign   [place]  [facility] [place], Marzella Schlein, MD       Future Appointments             In 2 months Bacigalupo, Marzella Schlein, MD [facility]             chlorthalidone (HYGROTON) 25 MG tablet [Pharmacy Med Name: CHLORTHALIDONE 25 MG TABLET] 90 tablet 1    Sig: TAKE 1 TABLET (25 MG TOTAL) BY MOUTH DAILY.     Cardiovascular: Diuretics - Thiazide Failed - 03/07/2024  2:39 PM      Failed - Cr in normal range and within 180 days    Creatinine, Ser  Date Value Ref Range Status  05/27/2023 1.15 (H) 0.57 - 1.00 mg/dL Final         Failed - K in normal range and within 180 days    Potassium  Date Value Ref Range Status  05/27/2023 3.6 3.5 - 5.2 mmol/L Final         Failed - Na in normal range and within 180 days    Sodium  Date Value Ref Range Status  05/27/2023 141 134 - 144 mmol/L Final         Passed - Last BP in normal range    BP Readings from Last 1 Encounters:  02/02/24 107/64         Passed - Valid encounter  within last 6 months    Recent Outpatient Visits           1 month ago Acute non-recurrent pansinusitis   [place] [facility], Marzella Schlein, MD   3 months ago Welcome to [employer] preventive visit   [facility] [place], Marzella Schlein, MD   9 months ago Essential hypertension, benign   [place] [facility] [place], Marzella Schlein, MD   1 year ago Encounter for annual physical exam   [place] [facility] [place], Marzella Schlein, MD   1 year ago Essential hypertension, benign   [place] [facility] [place], Marzella Schlein, MD       Future Appointments             In 2 months Bacigalupo, Marzella Schlein, MD [facility]

## 2024-04-26 ENCOUNTER — Other Ambulatory Visit: Payer: Self-pay | Admitting: Family Medicine

## 2024-04-26 DIAGNOSIS — Z1231 Encounter for screening mammogram for malignant neoplasm of breast: Secondary | ICD-10-CM

## 2024-05-03 ENCOUNTER — Ambulatory Visit
Admission: RE | Admit: 2024-05-03 | Discharge: 2024-05-03 | Disposition: A | Discharge status: Home / Self Care | Source: Ambulatory Visit | Attending: Family Medicine

## 2024-05-03 DIAGNOSIS — Z1231 Encounter for screening mammogram for malignant neoplasm of breast: Secondary | ICD-10-CM

## 2024-05-04 ENCOUNTER — Encounter: Payer: Self-pay | Admitting: Family Medicine

## 2024-05-31 ENCOUNTER — Other Ambulatory Visit: Payer: Self-pay | Admitting: Family Medicine

## 2024-06-01 ENCOUNTER — Ambulatory Visit: Payer: Self-pay | Admitting: Family Medicine

## 2024-06-08 ENCOUNTER — Other Ambulatory Visit: Payer: Self-pay | Admitting: Nurse Practitioner

## 2024-06-08 DIAGNOSIS — Z9189 Other specified personal risk factors, not elsewhere classified: Secondary | ICD-10-CM

## 2024-06-08 DIAGNOSIS — M8589 Other specified disorders of bone density and structure, multiple sites: Secondary | ICD-10-CM

## 2024-06-08 NOTE — Telephone Encounter (Signed)
 Med refill request: alendronate  70 mg Last AEX: 06/14/23 Next AEX: 09/01/24 Last MMG (if hormonal med) 05/03/24 BI-RADS 1 negative Refill authorized: alendronate  70 mg Please approve or deny as appropriate.

## 2024-06-19 ENCOUNTER — Other Ambulatory Visit: Payer: Self-pay | Admitting: Family Medicine

## 2024-06-19 DIAGNOSIS — G4700 Insomnia, unspecified: Secondary | ICD-10-CM

## 2024-06-26 ENCOUNTER — Encounter: Payer: Self-pay | Admitting: Family Medicine

## 2024-06-26 ENCOUNTER — Ambulatory Visit (INDEPENDENT_AMBULATORY_CARE_PROVIDER_SITE_OTHER): Admitting: Family Medicine

## 2024-06-26 VITALS — BP 124/79 | HR 87 | Ht 64.0 in | Wt 170.8 lb

## 2024-06-26 DIAGNOSIS — R7303 Prediabetes: Secondary | ICD-10-CM | POA: Diagnosis not present

## 2024-06-26 DIAGNOSIS — M8580 Other specified disorders of bone density and structure, unspecified site: Secondary | ICD-10-CM

## 2024-06-26 DIAGNOSIS — I10 Essential (primary) hypertension: Secondary | ICD-10-CM | POA: Diagnosis not present

## 2024-06-26 DIAGNOSIS — E782 Mixed hyperlipidemia: Secondary | ICD-10-CM

## 2024-06-26 DIAGNOSIS — N3090 Cystitis, unspecified without hematuria: Secondary | ICD-10-CM | POA: Diagnosis not present

## 2024-06-26 DIAGNOSIS — Z780 Asymptomatic menopausal state: Secondary | ICD-10-CM | POA: Diagnosis not present

## 2024-06-26 DIAGNOSIS — R3911 Hesitancy of micturition: Secondary | ICD-10-CM | POA: Diagnosis not present

## 2024-06-26 LAB — POCT URINALYSIS DIPSTICK
Bilirubin, UA: NEGATIVE
Blood, UA: NEGATIVE
Glucose, UA: NEGATIVE
Ketones, UA: NEGATIVE
Nitrite, UA: POSITIVE
Protein, UA: NEGATIVE
Spec Grav, UA: 1.02 (ref 1.010–1.025)
Urobilinogen, UA: 0.2 U/dL
pH, UA: 6 (ref 5.0–8.0)

## 2024-06-26 MED ORDER — CEPHALEXIN 500 MG PO CAPS
500.0000 mg | ORAL_CAPSULE | Freq: Three times a day (TID) | ORAL | 0 refills | Status: AC
Start: 2024-06-26 — End: 2024-07-01

## 2024-06-26 NOTE — Assessment & Plan Note (Signed)
 Previous lab results indicated prediabetes. Monitoring is necessary to assess current status. - Order labs to check cholesterol, kidney and liver function, and A1c

## 2024-06-26 NOTE — Assessment & Plan Note (Signed)
 Recheck lipids annually

## 2024-06-26 NOTE — Progress Notes (Signed)
 Established patient visit   Patient: Katrina Simpson   DOB: 03-Oct-1956   68 y.o. Female  MRN: [id number] Visit Date: 06/26/2024  Today's healthcare provider: Jon Eva, MD   Chief Complaint  Patient presents with   Medical Management of Chronic Issues   Hypertension    Patient does not monitor at home. She reports no symptoms and taking medication as prescribed   Hyperlipidemia    She reports no symptoms and taking medication as prescibed   Osteopenia    She reports taking medication as prescribed   Urinary Tract Infection    Pressure like she has to pee that has been coming and going since Thursday.    Subjective    Hypertension  Hyperlipidemia  Urinary Tract Infection    HPI     Hypertension    Additional comments: Patient does not monitor at home. She reports no symptoms and taking medication as prescribed        Hyperlipidemia    Additional comments: She reports no symptoms and taking medication as prescibed        Osteopenia    Additional comments: She reports taking medication as prescribed        Urinary Tract Infection    Additional comments: Pressure like she has to pee that has been coming and going since Thursday.       Last edited by Lilian Fitzpatrick, CMA on 06/26/2024  3:52 PM.       Discussed the use of AI scribe software for clinical note transcription with the patient, who gave verbal consent to proceed.  History of Present Illness   Katrina Simpson is a 68 year old female who presents with urinary symptoms.  She experiences a sensation of pressure and an increased urge to urinate, particularly in the evenings. These symptoms have been present for a few days and began after swimming on the last day of her vacation. She questions if the swimming pool could be a source of her symptoms.  She takes chlorthalidone  25 mg daily for hypertension and alendronate  weekly for bone health. Previous lab results indicated blood sugar in  the pre-diabetic range. She is due for follow-up blood work to monitor cholesterol, kidney and liver function, and vitamin D levels.  She is retired but remains active, working three mornings a week and caring for her grandchildren three afternoons a week. No other current concerns aside from urinary symptoms.         Medications: Outpatient Medications Prior to Visit  Medication Sig   alendronate  (FOSAMAX ) 70 MG tablet TAKE 1 TABLET (70 MG TOTAL) BY MOUTH EVERY 7 DAYS WITH FULL GLASS WATER ON EMPTY STOMACH   chlorthalidone  (HYGROTON ) 25 MG tablet TAKE 1 TABLET (25 MG TOTAL) BY MOUTH DAILY.   nortriptyline  (PAMELOR ) 50 MG capsule TAKE 1 CAPSULE BY MOUTH EVERYDAY AT BEDTIME   polyethylene glycol (MIRALAX / GLYCOLAX) 17 g packet Take 17 g by mouth daily.   promethazine -dextromethorphan (PROMETHAZINE -DM) 6.25-15 MG/5ML syrup Take 5 mLs by mouth at bedtime as needed for cough.   rosuvastatin  (CRESTOR ) 5 MG tablet TAKE 1 TABLET (5 MG TOTAL) BY MOUTH DAILY.   No facility-administered medications prior to visit.    Review of Systems     Objective    BP 124/79 (BP Location: Left Arm, Patient Position: Sitting, Cuff Size: Normal)   Pulse 87   Ht 5' 4 (1.626 m)   Wt 170 lb 12.8 oz (77.5 kg)   LMP  12/28/1998   SpO2 98%   BMI 29.32 kg/m    Physical Exam Vitals reviewed.  Constitutional:      General: She is not in acute distress.    Appearance: Normal appearance. She is well-developed. She is not diaphoretic.  HENT:     Head: Normocephalic and atraumatic.   Eyes:     General: No scleral icterus.    Conjunctiva/sclera: Conjunctivae normal.   Neck:     Thyroid: No thyromegaly.   Cardiovascular:     Rate and Rhythm: Normal rate and regular rhythm.     Heart sounds: Normal heart sounds. No murmur heard. Pulmonary:     Effort: Pulmonary effort is normal. No respiratory distress.     Breath sounds: Normal breath sounds. No wheezing, rhonchi or rales.   Musculoskeletal:      Cervical back: Neck supple.     Right lower leg: No edema.     Left lower leg: No edema.  Lymphadenopathy:     Cervical: No cervical adenopathy.   Skin:    General: Skin is warm and dry.     Findings: No rash.   Neurological:     Mental Status: She is alert and oriented to person, place, and time. Mental status is at baseline.   Psychiatric:        Mood and Affect: Mood normal.        Behavior: Behavior normal.      Results for orders placed or performed in visit on 06/26/24  POCT Urinalysis Dipstick  Result Value Ref Range   Color, UA     Clarity, UA     Glucose, UA Negative Negative   Bilirubin, UA negative    Ketones, UA negative    Spec Grav, UA 1.020 1.010 - 1.025   Blood, UA negative    pH, UA 6.0 5.0 - 8.0   Protein, UA Negative Negative   Urobilinogen, UA 0.2 0.2 or 1.0 E.U./dL   Nitrite, UA positive    Leukocytes, UA Moderate (2+) (A) Negative   Appearance     Odor      Assessment & Plan     Problem List Items Addressed This Visit       Cardiovascular and Mediastinum   Essential hypertension, benign   Blood pressure is well-controlled on current medication regimen. - Continue chlorthalidone  25 mg daily      Relevant Orders   Comprehensive metabolic panel with GFR     Musculoskeletal and Integument   Osteopenia after menopause   being managed with alendronate  (Fosamax ) once weekly. No issues reported with current treatment. - Continue alendronate  (Fosamax ) once weekly - Order vitamin D level with labs      Relevant Orders   VITAMIN D 25 Hydroxy (Vit-D Deficiency, Fractures)     Other   Hyperlipidemia   Recheck lipids annually      Relevant Orders   Comprehensive metabolic panel with GFR   Lipid panel   Prediabetes   Previous lab results indicated prediabetes. Monitoring is necessary to assess current status. - Order labs to check cholesterol, kidney and liver function, and A1c       Relevant Orders   Hemoglobin A1c   Other Visit  Diagnoses       Cystitis    -  Primary   Relevant Orders   POCT Urinalysis Dipstick (Completed)   Urine Culture           Urinary tract infection Acute urinary tract infection with symptoms of  urinary urgency and pressure, particularly in the evening. Urinalysis shows moderate leukocytes and positive nitrates, indicating bacterial infection. No recent history of UTIs to guide antibiotic choice. Cephalexin is chosen due to low cross-reactivity with penicillin allergies and short treatment duration. - Prescribe cephalexin (Keflex) 3 times a day for 5 days - Send urine sample for culture to confirm sensitivity to cephalexin - Adjust antibiotic if culture shows resistance      Return in about 5 months (around 11/26/2024) for CPE and AWV with [facility]HA after 12/3.       Jon Eva, MD  [facility] [phone] (phone) [phone] (fax)  [facility]

## 2024-06-26 NOTE — Assessment & Plan Note (Signed)
 Blood pressure is well-controlled on current medication regimen. - Continue chlorthalidone  25 mg daily

## 2024-06-26 NOTE — Assessment & Plan Note (Signed)
 being managed with alendronate  (Fosamax ) once weekly. No issues reported with current treatment. - Continue alendronate  (Fosamax ) once weekly - Order vitamin D level with labs

## 2024-06-28 LAB — URINE CULTURE

## 2024-06-28 LAB — SPECIMEN STATUS REPORT

## 2024-06-29 ENCOUNTER — Ambulatory Visit: Payer: Self-pay | Admitting: Family Medicine

## 2024-08-05 ENCOUNTER — Other Ambulatory Visit: Payer: Self-pay | Admitting: Family Medicine

## 2024-08-05 DIAGNOSIS — I10 Essential (primary) hypertension: Secondary | ICD-10-CM

## 2024-08-28 ENCOUNTER — Other Ambulatory Visit: Payer: Self-pay | Admitting: Family Medicine

## 2024-08-28 ENCOUNTER — Other Ambulatory Visit: Payer: Self-pay | Admitting: Radiology

## 2024-08-28 DIAGNOSIS — Z9189 Other specified personal risk factors, not elsewhere classified: Secondary | ICD-10-CM

## 2024-08-28 DIAGNOSIS — M8589 Other specified disorders of bone density and structure, multiple sites: Secondary | ICD-10-CM

## 2024-08-29 NOTE — Telephone Encounter (Signed)
 Med refill request: fosamax   Last AEX: 06/14/23 Next AEX: not scheduled message sent to FD  Last MMG (if hormonal med) 05/03/24 BIRADS Cat 1 neg  Refill authorized: last rx 06/09/24 #12 with 0 refills. Please approve or deny

## 2024-08-31 ENCOUNTER — Other Ambulatory Visit: Payer: Self-pay | Admitting: Family Medicine

## 2024-08-31 DIAGNOSIS — I10 Essential (primary) hypertension: Secondary | ICD-10-CM

## 2024-09-01 ENCOUNTER — Encounter: Admitting: Nurse Practitioner

## 2024-09-05 DIAGNOSIS — I10 Essential (primary) hypertension: Secondary | ICD-10-CM | POA: Diagnosis not present

## 2024-09-05 DIAGNOSIS — M8580 Other specified disorders of bone density and structure, unspecified site: Secondary | ICD-10-CM | POA: Diagnosis not present

## 2024-09-05 DIAGNOSIS — R7303 Prediabetes: Secondary | ICD-10-CM | POA: Diagnosis not present

## 2024-09-05 DIAGNOSIS — E782 Mixed hyperlipidemia: Secondary | ICD-10-CM | POA: Diagnosis not present

## 2024-09-05 DIAGNOSIS — Z780 Asymptomatic menopausal state: Secondary | ICD-10-CM | POA: Diagnosis not present

## 2024-09-06 ENCOUNTER — Other Ambulatory Visit: Payer: Self-pay | Admitting: Family Medicine

## 2024-09-06 DIAGNOSIS — I10 Essential (primary) hypertension: Secondary | ICD-10-CM

## 2024-09-06 LAB — LIPID PANEL
Chol/HDL Ratio: 3.7 ratio (ref 0.0–4.4)
Cholesterol, Total: 167 mg/dL (ref 100–199)
HDL: 45 mg/dL (ref >39)
LDL Chol Calc (NIH): 96 mg/dL (ref 0–99)
Triglycerides: 147 mg/dL (ref 0–149)
VLDL Cholesterol Cal: 26 mg/dL (ref 5–40)

## 2024-09-06 LAB — COMPREHENSIVE METABOLIC PANEL WITH GFR
ALT: 23 IU/L (ref 0–32)
AST: 23 IU/L (ref 0–40)
Albumin: 4.4 g/dL (ref 3.9–4.9)
Alkaline Phosphatase: 65 IU/L (ref 44–121)
BUN/Creatinine Ratio: 15 (ref 12–28)
BUN: 20 mg/dL (ref 8–27)
Bilirubin Total: 0.6 mg/dL (ref 0.0–1.2)
CO2: 26 mmol/L (ref 20–29)
Calcium: 9.9 mg/dL (ref 8.7–10.3)
Chloride: 99 mmol/L (ref 96–106)
Creatinine, Ser: 1.31 mg/dL — ABNORMAL HIGH (ref 0.57–1.00)
Globulin, Total: 2.7 g/dL (ref 1.5–4.5)
Glucose: 91 mg/dL (ref 70–99)
Potassium: 4.1 mmol/L (ref 3.5–5.2)
Sodium: 142 mmol/L (ref 134–144)
Total Protein: 7.1 g/dL (ref 6.0–8.5)
eGFR: 44 mL/min/1.73 — ABNORMAL LOW (ref >59)

## 2024-09-06 LAB — VITAMIN D 25 HYDROXY (VIT D DEFICIENCY, FRACTURES): Vit D, 25-Hydroxy: 65.8 ng/mL (ref 30.0–100.0)

## 2024-09-06 LAB — HEMOGLOBIN A1C
Est. average glucose Bld gHb Est-mCnc: 117 mg/dL
Hgb A1c MFr Bld: 5.7 % — ABNORMAL HIGH (ref 4.8–5.6)

## 2024-09-07 NOTE — Telephone Encounter (Unsigned)
 Copied from CRM [id number]. Topic: Clinical - Medication Question >> Sep 07, 2024  1:02 PM Avram MATSU wrote: Reason for CRM: patient would like to know if the provider can please send the medication today chlorthalidone  (HYGROTON ) 25 MG tablet [[id number]]. I informed her a request was received and is still pending. Patient would also like a 3 month supply and she is taking her last pill today.

## 2024-09-21 ENCOUNTER — Encounter: Payer: Self-pay | Admitting: Family Medicine

## 2024-09-22 ENCOUNTER — Other Ambulatory Visit: Payer: Self-pay | Admitting: Family Medicine

## 2024-10-09 ENCOUNTER — Encounter: Admitting: Nurse Practitioner

## 2024-10-09 NOTE — Progress Notes (Deleted)
   Katrina Simpson 1956-12-09 [id number]   History:  68 y.o. G3P0004 presents for breast and pelvic exam without GYN complaints. Postmenopausal - no HRT, no bleeding. 1996 LGSIL, subsequent paps normal. Normal mammogram history. HTN, HLD managed by PCP. Started Fosamax  July 2024 for elevated FRAX.   Gynecologic History Patient's last menstrual period was 12/28/1998.   Contraception/Family planning: post menopausal status Sexually active: Yes  Health Maintenance Last Pap: 04/17/2020. Results were: Normal neg HPV Last mammogram: 05/03/2024. Results were: Normal Last colonoscopy: 04/2018. Results: Normal. 5-year recall Last Dexa: 07/06/2023. Results were: T-score -2.2, FRAX 21% / 4.0%  Past medical history, past surgical history, family history and social history were all reviewed and documented in the EPIC chart. Married. Retired December 2023. 4 sons, 5 grandchildren age 19-29 yo.   ROS:  A ROS was performed and pertinent positives and negatives are included.  Exam:  There were no vitals filed for this visit.    There is no height or weight on file to calculate BMI.  General appearance:  Normal Thyroid:  Symmetrical, normal in size, without palpable masses or nodularity. Respiratory  Auscultation:  Clear without wheezing or rhonchi Cardiovascular  Auscultation:  Regular rate, without rubs, murmurs or gallops  Edema/varicosities:  Not grossly evident Abdominal  Soft,nontender, without masses, guarding or rebound.  Liver/spleen:  No organomegaly noted  Hernia:  None appreciated  Skin  Inspection:  Grossly normal Breasts: Examined lying and sitting.   Right: Without masses, retractions, nipple discharge or axillary adenopathy.   Left: Without masses, retractions, nipple discharge or axillary adenopathy. Pelvic: External genitalia:  no lesions              Urethra:  normal appearing urethra with no masses, tenderness or lesions              Bartholins and Skenes: normal                  Vagina: normal appearing vagina with normal color and discharge, no lesions. Atrophic changes              Cervix: no lesions Bimanual Exam:  Uterus:  no masses or tenderness              Adnexa: no mass, fullness, tenderness              Rectovaginal: Deferred              Anus:  normal, no lesions   Assessment/Plan:  68 y.o. G3P0004 for breast and pelvic exam.   Encounter for breast and pelvic examination - Education provided on SBEs, importance of preventative screenings, current guidelines, high calcium  diet, regular exercise, and multivitamin daily. Labs with PCP.   Postmenopausal - no HRT, no bleeding  Screening for cervical cancer - 1976 LGSIL. Pap today. If normal can stop screening per guidelines.   Screening for breast cancer - Normal mammogram history.  Continue annual screenings. Normal breast exam today.  Screening for colon cancer - 2019 colonoscopy. Received letter and plans to schedule soon.   No follow-ups on file.     Katrina DELENA Shutter DNP, 12:55 PM 10/09/2024

## 2024-10-23 DIAGNOSIS — L820 Inflamed seborrheic keratosis: Secondary | ICD-10-CM | POA: Diagnosis not present

## 2024-10-23 DIAGNOSIS — Z419 Encounter for procedure for purposes other than remedying health state, unspecified: Secondary | ICD-10-CM | POA: Diagnosis not present

## 2024-11-02 ENCOUNTER — Encounter: Payer: Self-pay | Admitting: Nurse Practitioner

## 2024-11-02 ENCOUNTER — Ambulatory Visit (INDEPENDENT_AMBULATORY_CARE_PROVIDER_SITE_OTHER): Admitting: Nurse Practitioner

## 2024-11-02 VITALS — BP 116/70 | HR 83 | Ht 62.75 in | Wt 170.0 lb

## 2024-11-02 DIAGNOSIS — Z780 Asymptomatic menopausal state: Secondary | ICD-10-CM | POA: Diagnosis not present

## 2024-11-02 DIAGNOSIS — B009 Herpesviral infection, unspecified: Secondary | ICD-10-CM

## 2024-11-02 DIAGNOSIS — M810 Age-related osteoporosis without current pathological fracture: Secondary | ICD-10-CM | POA: Diagnosis not present

## 2024-11-02 DIAGNOSIS — Z9289 Personal history of other medical treatment: Secondary | ICD-10-CM

## 2024-11-02 DIAGNOSIS — Z9189 Other specified personal risk factors, not elsewhere classified: Secondary | ICD-10-CM | POA: Diagnosis not present

## 2024-11-02 DIAGNOSIS — M8589 Other specified disorders of bone density and structure, multiple sites: Secondary | ICD-10-CM

## 2024-11-02 DIAGNOSIS — Z01419 Encounter for gynecological examination (general) (routine) without abnormal findings: Secondary | ICD-10-CM

## 2024-11-02 MED ORDER — ALENDRONATE SODIUM 70 MG PO TABS: 70.0000 mg | ORAL_TABLET | ORAL | 3 refills | Status: AC

## 2024-11-02 NOTE — Progress Notes (Signed)
 Katrina Simpson 05/09/1956 [id number]   History:  68 y.o. G3P0004 presents for breast and pelvic exam without GYN complaints. Postmenopausal - no HRT, no bleeding. 1996 LGSIL, subsequent paps normal. HTN, HLD managed by PCP. Started Fosamax  July 2024 for elevated FRAX.   Gynecologic History Patient's last menstrual period was 12/28/1998.   Contraception/Family planning: post menopausal status Sexually active: Yes  Health Maintenance Last Pap: 04/17/2020. Results were: Normal neg HPV Last mammogram: 05/03/2024. Results were: Normal Last colonoscopy: 04/2018. Results: Normal. 5-year recall Last Dexa: 07/06/2023. Results were: T-score -2.2, FRAX 21% / 4.0%  Past medical history, past surgical history, family history and social history were all reviewed and documented in the EPIC chart. Married. Retired December 2023. Working PT at [employer]. 4 sons, 5 grandchildren age 35-30.   ROS:  A ROS was performed and pertinent positives and negatives are included.  Exam:  Vitals:   11/02/24 1418  BP: 116/70  Pulse: 83  SpO2: 97%  Weight: 170 lb (77.1 kg)  Height: 5' 2.75 (1.594 m)      Body mass index is 30.35 kg/m.  General appearance:  Normal Thyroid:  Symmetrical, normal in size, without palpable masses or nodularity. Respiratory  Auscultation:  Clear without wheezing or rhonchi Cardiovascular  Auscultation:  Regular rate, without rubs, murmurs or gallops  Edema/varicosities:  Not grossly evident Abdominal  Soft,nontender, without masses, guarding or rebound.  Liver/spleen:  No organomegaly noted  Hernia:  None appreciated  Skin  Inspection:  Grossly normal Breasts: Examined lying and sitting.   Right: Without masses, retractions, nipple discharge or axillary adenopathy.   Left: Without masses, retractions, nipple discharge or axillary adenopathy. Pelvic: External genitalia:  no lesions              Urethra:  normal appearing urethra with no masses, tenderness or lesions               Bartholins and Skenes: normal                 Vagina: normal appearing vagina with normal color and discharge, no lesions. Atrophic changes              Cervix: no lesions Bimanual Exam:  Uterus:  no masses or tenderness              Adnexa: no mass, fullness, tenderness              Rectovaginal: Deferred              Anus:  normal, no lesions  Zada Louder, CMA present as chaperone.   Assessment/Plan:  68 y.o. G3P0004 for breast and pelvic exam.   Encounter for breast and pelvic examination - Education provided on SBEs, importance of preventative screenings, current guidelines, high calcium  diet, regular exercise, and multivitamin daily. Labs with PCP.   Postmenopausal - no HRT, no bleeding  Fracture Risk Assessment Score (FRAX) indicating greater than 20% risk for major osteoporosis-related fracture - Plan: alendronate  (FOSAMAX ) 70 MG tablet, DG Bone Density. DXA due July 2026.  Fracture Risk Assessment Score (FRAX) indicating greater than 3% risk for hip fracture - Plan: alendronate  (FOSAMAX ) 70 MG tablet, DG Bone Density. DXA due July 2026.  Osteopenia of multiple sites - Plan: alendronate  (FOSAMAX ) 70 MG tablet, DG Bone Density. DXA due July 2026.  Screening for cervical cancer - 1976 LGSIL. No longer screening per guidelines.   Screening for breast cancer - Normal mammogram history.  Continue annual screenings. Normal breast  exam today.  Screening for colon cancer - 2019 colonoscopy. Had to cancel but plans to schedule in January.   Return in about 1 year (around 11/02/2025) for B&P (high risk).     [name] DNP, 2:50 PM 11/02/2024

## 2024-11-03 ENCOUNTER — Telehealth: Payer: Self-pay

## 2024-11-03 DIAGNOSIS — Z1211 Encounter for screening for malignant neoplasm of colon: Secondary | ICD-10-CM

## 2024-11-03 NOTE — Telephone Encounter (Signed)
 Copied from CRM [id number]. Topic: Referral - Request for Referral >> Nov 03, 2024 10:04 AM Leonette SQUIBB wrote: Pt needs a referral to [facility] for a Colonscopy.  She had one but she had to cancel.  The referral has expired.

## 2024-11-06 NOTE — Telephone Encounter (Signed)
 Yes she is due 04/2025, but it will take some time to get referral processed and scheduled for procedure, so ok to go ahead and send ref to [facility] for colon cancer screening. Can type due in 5/26 in comments of referral.

## 2024-12-03 ENCOUNTER — Other Ambulatory Visit: Payer: Self-pay | Admitting: Family Medicine

## 2024-12-03 DIAGNOSIS — G4700 Insomnia, unspecified: Secondary | ICD-10-CM

## 2024-12-04 ENCOUNTER — Other Ambulatory Visit: Payer: Self-pay | Admitting: Family Medicine

## 2024-12-04 DIAGNOSIS — I10 Essential (primary) hypertension: Secondary | ICD-10-CM

## 2025-01-01 ENCOUNTER — Encounter: Payer: Self-pay | Admitting: Family Medicine

## 2025-01-01 ENCOUNTER — Ambulatory Visit (INDEPENDENT_AMBULATORY_CARE_PROVIDER_SITE_OTHER): Admitting: Family Medicine

## 2025-01-01 VITALS — BP 126/75 | HR 83 | Resp 14 | Ht 63.5 in | Wt 169.1 lb

## 2025-01-01 DIAGNOSIS — G4700 Insomnia, unspecified: Secondary | ICD-10-CM | POA: Diagnosis not present

## 2025-01-01 DIAGNOSIS — R7303 Prediabetes: Secondary | ICD-10-CM

## 2025-01-01 DIAGNOSIS — E782 Mixed hyperlipidemia: Secondary | ICD-10-CM

## 2025-01-01 DIAGNOSIS — I10 Essential (primary) hypertension: Secondary | ICD-10-CM | POA: Diagnosis not present

## 2025-01-01 MED ORDER — CHLORTHALIDONE 25 MG PO TABS: 25.0000 mg | ORAL_TABLET | Freq: Every day | ORAL | 3 refills | Status: AC

## 2025-01-01 MED ORDER — NORTRIPTYLINE HCL 50 MG PO CAPS: 50.0000 mg | ORAL_CAPSULE | Freq: Every day | ORAL | 3 refills | Status: AC

## 2025-01-01 MED ORDER — ROSUVASTATIN CALCIUM 5 MG PO TABS: 5.0000 mg | ORAL_TABLET | Freq: Every day | ORAL | 3 refills | Status: AC

## 2025-01-01 NOTE — Assessment & Plan Note (Signed)
Chronic and well controlled  Continue nortriptyline at current dose

## 2025-01-01 NOTE — Assessment & Plan Note (Signed)
 A1c was stable at last check. Monitoring is ongoing. - Ordered A1c test before next visit.

## 2025-01-01 NOTE — Progress Notes (Signed)
 "     Established patient visit   Patient: Katrina Simpson   DOB: 08-09-1956   69 y.o. Female  MRN: [id number] Visit Date: 01/01/2025  Today's healthcare provider: Jon Eva, MD   Chief Complaint  Patient presents with   Medical Management of Chronic Issues    6 month   Subjective    HPI HPI     Medical Management of Chronic Issues    Additional comments: 6 month      Last edited by Wilfred Hargis RAMAN, CMA on 01/01/2025  1:46 PM.       Discussed the use of AI scribe software for clinical note transcription with the patient, who gave verbal consent to proceed.  History of Present Illness   Katrina Simpson is a 69 year old female with hypertension, hyperlipidemia, and prediabetes who presents for a chronic follow-up visit.  She takes chlorthalidone  25 mg daily for hypertension, Crestor  5 mg daily for hyperlipidemia, and nortriptyline  50 mg QHS for chronic headaches. She recently had a cold with some shortness of breath but no concerning progression. For prediabetes she is due for lab monitoring, including an A1c. She has been instructed to take antibiotics before dental work due to a prior knee surgery.       Medications: Show/hide medication list[1]  Review of Systems     Objective    BP 126/75   Pulse 83   Resp 14   Ht 5' 3.5 (1.613 m)   Wt 169 lb 1.6 oz (76.7 kg)   LMP 12/28/1998   SpO2 97%   BMI 29.48 kg/m    Physical Exam Vitals reviewed.  Constitutional:      General: She is not in acute distress.    Appearance: Normal appearance. She is well-developed. She is not diaphoretic.  HENT:     Head: Normocephalic and atraumatic.  Eyes:     General: No scleral icterus.    Conjunctiva/sclera: Conjunctivae normal.  Neck:     Thyroid: No thyromegaly.  Cardiovascular:     Rate and Rhythm: Normal rate and regular rhythm.     Heart sounds: Normal heart sounds. No murmur heard. Pulmonary:     Effort: Pulmonary effort is normal. No respiratory distress.      Breath sounds: Normal breath sounds. No wheezing, rhonchi or rales.  Musculoskeletal:     Cervical back: Neck supple.     Right lower leg: No edema.     Left lower leg: No edema.  Lymphadenopathy:     Cervical: No cervical adenopathy.  Skin:    General: Skin is warm and dry.     Findings: No rash.  Neurological:     Mental Status: She is alert and oriented to person, place, and time. Mental status is at baseline.  Psychiatric:        Mood and Affect: Mood normal.        Behavior: Behavior normal.      No results found for any visits on 01/01/25.  Assessment & Plan     Problem List Items Addressed This Visit       Cardiovascular and Mediastinum   Essential hypertension, benign - Primary   Blood pressure is well-controlled with current medication regimen. - Continue chlorthalidone  25 mg daily.      Relevant Medications   chlorthalidone  (HYGROTON ) 25 MG tablet   rosuvastatin  (CRESTOR ) 5 MG tablet   Other Relevant Orders   Comprehensive metabolic panel with GFR     Other  Hyperlipidemia   Cholesterol levels are managed with current medication. - Continue Crestor  5 mg daily.      Relevant Medications   chlorthalidone  (HYGROTON ) 25 MG tablet   rosuvastatin  (CRESTOR ) 5 MG tablet   Other Relevant Orders   Comprehensive metabolic panel with GFR   Lipid panel   Insomnia   Chronic and well controlled  Continue nortriptyline  at current dose      Relevant Medications   nortriptyline  (PAMELOR ) 50 MG capsule   Prediabetes   A1c was stable at last check. Monitoring is ongoing. - Ordered A1c test before next visit.      Relevant Orders   Hemoglobin A1c        General Health Maintenance Routine health maintenance discussed, including lab monitoring and dental care. - Scheduled physical exam in six months. - Provided lab slip for A1c, kidney function, and cholesterol tests to be done one week before next visit. - Provided letter for dental office regarding  antibiotic prophylaxis guidelines.        Return in about 6 months (around 07/01/2025) for CPE, and AWV with [facility].       Jon Eva, MD  [facility] [phone] (phone) [phone] (fax)  [facility]     [1]  Outpatient Medications Prior to Visit  Medication Sig   alendronate  (FOSAMAX ) 70 MG tablet Take 1 tablet (70 mg total) by mouth once a week. Take with a full glass of water on an empty stomach.   polyethylene glycol (MIRALAX / GLYCOLAX) 17 g packet Take 17 g by mouth daily.   [DISCONTINUED] chlorthalidone  (HYGROTON ) 25 MG tablet TAKE 1 TABLET (25 MG TOTAL) BY MOUTH DAILY.   [DISCONTINUED] nortriptyline  (PAMELOR ) 50 MG capsule TAKE 1 CAPSULE BY MOUTH EVERYDAY AT BEDTIME   [DISCONTINUED] rosuvastatin  (CRESTOR ) 5 MG tablet TAKE 1 TABLET (5 MG TOTAL) BY MOUTH DAILY.   [DISCONTINUED] promethazine -dextromethorphan (PROMETHAZINE -DM) 6.25-15 MG/5ML syrup Take 5 mLs by mouth at bedtime as needed for cough.   No facility-administered medications prior to visit.   "

## 2025-01-01 NOTE — Assessment & Plan Note (Signed)
 Blood pressure is well-controlled with current medication regimen. - Continue chlorthalidone  25 mg daily.

## 2025-01-01 NOTE — Assessment & Plan Note (Signed)
 Cholesterol levels are managed with current medication. - Continue Crestor  5 mg daily.

## 2025-08-08 ENCOUNTER — Ambulatory Visit

## 2025-11-05 ENCOUNTER — Encounter: Admitting: Nurse Practitioner
